# Patient Record
Sex: Male | Born: 1980 | Race: White | Hispanic: No | Marital: Married | Smoking: Former smoker
Health system: Southern US, Community
[De-identification: ages and names within clinical notes are randomized; demographics above are authoritative.]

## PROBLEM LIST (undated history)

## (undated) DIAGNOSIS — F419 Anxiety disorder, unspecified: Secondary | ICD-10-CM

## (undated) DIAGNOSIS — F329 Major depressive disorder, single episode, unspecified: Secondary | ICD-10-CM

## (undated) DIAGNOSIS — F32A Depression, unspecified: Secondary | ICD-10-CM

## (undated) DIAGNOSIS — G4730 Sleep apnea, unspecified: Secondary | ICD-10-CM

## (undated) DIAGNOSIS — M5030 Other cervical disc degeneration, unspecified cervical region: Secondary | ICD-10-CM

## (undated) HISTORY — DX: Major depressive disorder, single episode, unspecified: F32.9

## (undated) HISTORY — DX: Depression, unspecified: F32.A

## (undated) HISTORY — PX: DENTAL SURGERY: SHX609

## (undated) HISTORY — PX: OTHER SURGICAL HISTORY: SHX169

---

## 2006-09-30 ENCOUNTER — Emergency Department: Payer: Self-pay | Admitting: Emergency Medicine

## 2011-05-19 ENCOUNTER — Emergency Department (HOSPITAL_COMMUNITY)
Admission: EM | Admit: 2011-05-19 | Discharge: 2011-05-19 | Disposition: A | Discharge status: Home / Self Care | Payer: Worker's Compensation | Attending: Emergency Medicine | Admitting: Emergency Medicine

## 2011-05-19 ENCOUNTER — Encounter (HOSPITAL_COMMUNITY): Payer: Self-pay | Admitting: *Deleted

## 2011-05-19 DIAGNOSIS — S058X9A Other injuries of unspecified eye and orbit, initial encounter: Secondary | ICD-10-CM | POA: Insufficient documentation

## 2011-05-19 DIAGNOSIS — IMO0002 Reserved for concepts with insufficient information to code with codable children: Secondary | ICD-10-CM | POA: Insufficient documentation

## 2011-05-19 DIAGNOSIS — S0500XA Injury of conjunctiva and corneal abrasion without foreign body, unspecified eye, initial encounter: Secondary | ICD-10-CM

## 2011-05-19 MED ORDER — OXYCODONE-ACETAMINOPHEN 5-325 MG PO TABS: 1.0000 | ORAL_TABLET | ORAL | Status: AC | PRN

## 2011-05-19 MED ORDER — OXYCODONE-ACETAMINOPHEN 5-325 MG PO TABS
2.0000 | ORAL_TABLET | Freq: Once | ORAL | Status: AC
  Administered 2011-05-19: 2 via ORAL
  Filled 2011-05-19: qty 2

## 2011-05-19 MED ORDER — ERYTHROMYCIN 5 MG/GM OP OINT: TOPICAL_OINTMENT | OPHTHALMIC | Status: AC

## 2011-05-19 MED ORDER — TETRACAINE HCL 0.5 % OP SOLN
1.0000 [drp] | Freq: Once | OPHTHALMIC | Status: DC
  Filled 2011-05-19: qty 2

## 2011-05-19 MED ORDER — FLUORESCEIN SODIUM 1 MG OP STRP
1.0000 | ORAL_STRIP | Freq: Once | OPHTHALMIC | Status: DC
  Filled 2011-05-19: qty 1

## 2011-05-19 NOTE — Discharge Instructions (Signed)
Eye Injury The eye can be injured by scratches, foreign bodies, contact lenses, very bright light (welding torches), and chemical irritation. The cornea (the clear part of the eye) is very sensitive; even minor injuries to it are painful. Most injuries to the cornea heal in 2-4 days. Treatment may include:  Antibiotic eye drops or ointment may be needed to soothe the eye and prevent infection. Drops that numb the eye (anesthetic drops) should not be used repeatedly as they can delay healing. Drops to dilate the pupil for 1-2 days are sometimes used to relieve pain.   Patching the eye can reduce irritation from blinking and bright light. When your eye is patched, you should not drive or operate machinery because your side vision and your ability to judge distances are decreased.   Rest your eye. Stay in a darkened room and wear sunglasses to reduce the irritation from light.   Do not rub your eye for the next 2 weeks to allow complete healing.  If you have contact lenses, do not wear them until your caregiver says it is safe to do so. Pain medicine may also be needed for 1-2 days.  SEEK MEDICAL CARE IF:   You have increased pain, persistent irritation or blurred vision over the next 2 days.   Your symptoms are getting worse or not improving.   You have any other questions or concerns regarding your injury.  Document Released: 03/24/2004 Document Revised: 02/03/2011 Document Reviewed: 02/14/2005 [facility] Patient Information 2012 [place], [place].Corneal Abrasion The cornea is the clear covering at the front and center of the eye. When looking at the colored portion (iris) of the eye, you are looking through that person's cornea.  This very thin tissue is made up of many layers. The surface layer is a single layer of cells called the corneal epithelium. This is one of the most sensitive tissues in the body. If a scratch or injury causes the corneal epithelium to come off, it is called a corneal  abrasion. If the injury extends to the tissues below the epithelium, the condition is called a corneal ulcer.  CAUSES   Scratches.   Trauma.   Foreign body in the eye.   Some people have recurrences of abrasions in the area of the original injury even after they heal. This is called recurrent erosion syndrome. Recurrent erosion syndromes generally improve and go away with time.  SYMPTOMS   Eye pain.   Difficulty or inability to keep the injured eye open.   The eye becomes very sensitive to light.   Recurrent erosions tend to happen suddenly, first thing in the morning - usually upon awakening and opening the eyes.  DIAGNOSIS  Your eye professional can diagnose a corneal abrasion during an eye exam. Dye is usually placed in the eye using a drop or a small paper strip moistened by the patient's tears. When the eye is examined with a special light, the abrasion shows up clearly because of the dye. TREATMENT   Small abrasions may be treated with antibiotic drops or ointment alone.   Usually a pressure patch is specially applied. Pressure patches prevent the eye from blinking, allowing the corneal epithelium to heal. Because blinking is less, a pressure patch also reduces the amount of pain present in the eye during healing. Most corneal abrasions heal within 2-3 days with no effect on vision. WARNING: Do not drive or operate machinery while your eye is patched. Your ability to judge distances is impaired.   If  abrasion becomes infected and spreads to the deeper tissues of the cornea, a corneal ulcer can result. This is serious because it can cause corneal scarring. Corneal scars interfere with light passing through the cornea, and cause a loss of vision in the involved eye.   If your caregiver has given you a follow-up appointment, it is very important to keep that appointment. Not keeping the appointment could result in a severe eye infection or permanent loss of vision. If there is any  problem keeping the appointment, you must call back to this facility for assistance.  SEEK MEDICAL CARE IF:   You have pain, light sensitivity and a scratchy feeling in one eye (or both).   Your pressure patch keeps loosening up and you can blink your eye under the patch after treatment.   Any kind of discharge develops from the involved eye after treatment or if the lids stick together in the morning.   You have the same symptoms in the morning as you did with the original abrasion days, weeks or months after the abrasion healed.  MAKE SURE YOU:   Understand these instructions.   Will watch your condition.   Will get help right away if you are not doing well or get worse.  Document Released: 02/12/2000 Document Revised: 02/03/2011 Document Reviewed: 09/20/2007 [facility] Patient Information 2012 [place], [place].  Return for any new or worsening symptoms or any other concerns.

## 2011-05-19 NOTE — ED Provider Notes (Signed)
History     CSN: [id number]  Arrival date & time 05/19/11  1531   First MD Initiated Contact with Patient 05/19/11 1552      Chief Complaint  Patient presents with  . Eye Injury    (Consider location/radiation/quality/duration/timing/severity/associated sxs/prior treatment) HPI Cc left eye pain, hit eye with wiring while at work, pain is sharp, worse with blinking, no vision changes.  History reviewed. No pertinent past medical history.  History reviewed. No pertinent past surgical history.  No family history on file.  History  Substance Use Topics  . Smoking status: Not on file  . Smokeless tobacco: Not on file  . Alcohol Use: No      Review of Systems  Eyes: Positive for pain and redness.  All other systems reviewed and are negative.    Allergies  Review of patient's allergies indicates no known allergies.  Home Medications   Current Outpatient Rx  Name Route Sig Dispense Refill  . OXYCODONE-ACETAMINOPHEN 5-325 MG PO TABS Oral Take 1 tablet by mouth every 4 (four) hours as needed for pain. 6 tablet 0    BP 121/78  Pulse 107  Temp(Src) 98 F (36.7 C) (Oral)  Resp 20  SpO2 97%  Physical Exam  Nursing note and vitals reviewed. Constitutional: He appears well-developed and well-nourished.  HENT:  Head: Normocephalic and atraumatic.  Eyes: Pupils are equal, round, and reactive to light. Right eye exhibits no discharge. Left eye exhibits no discharge and no exudate. No foreign body present in the left eye. Left conjunctiva is injected. Left conjunctiva has no hemorrhage.  Slit lamp exam:      The left eye shows fluorescein uptake. The left eye shows no corneal flare and no foreign body.    Abdominal: Soft. There is no tenderness.  Skin: Skin is warm and dry.    ED Course  Procedures (including critical care time)  Labs Reviewed - No data to display No results found.   1. Corneal abrasion       MDM  Vision 20/20 bilat, no cell or flare on  SL, PERRLA, woods lamp exam shows large central abrasion, neg seidels, will d/c with pain meds, erythromycin ointment       Elijio Miles, MD 05/20/11 0104

## 2011-05-19 NOTE — ED Provider Notes (Signed)
31 year-old male suffered an injury to his left eye from an electrical wire. Exam shows a rather large corneal abrasion and left eye without evidence of penetrating injury. He will be treated with analgesics and antibiotic ointment.  Dione Booze, MD 05/19/11 [phone]
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
## 2011-05-25 NOTE — ED Provider Notes (Signed)
I saw and evaluated the patient, reviewed the resident's note and I agree with the findings and plan.   Reene Harlacher, MD 05/25/11 1429 

## 2012-01-18 ENCOUNTER — Emergency Department: Payer: Self-pay | Admitting: Emergency Medicine

## 2013-09-30 ENCOUNTER — Emergency Department: Payer: Self-pay | Admitting: Emergency Medicine

## 2013-09-30 LAB — CBC WITH DIFFERENTIAL/PLATELET
BASOS ABS: 0.2 10*3/uL — AB (ref 0.0–0.1)
Basophil %: 0.7 %
EOS PCT: 0.9 %
Eosinophil #: 0.2 10*3/uL (ref 0.0–0.7)
HCT: 46.1 % (ref 40.0–52.0)
HGB: 15.3 g/dL (ref 13.0–18.0)
LYMPHS ABS: 2.3 10*3/uL (ref 1.0–3.6)
LYMPHS PCT: 10.1 %
MCH: 28.6 pg (ref 26.0–34.0)
MCHC: 33.3 g/dL (ref 32.0–36.0)
MCV: 86 fL (ref 80–100)
MONOS PCT: 7 %
Monocyte #: 1.6 x10 3/mm — ABNORMAL HIGH (ref 0.2–1.0)
NEUTROS PCT: 81.3 %
Neutrophil #: 18.2 10*3/uL — ABNORMAL HIGH (ref 1.4–6.5)
Platelet: 200 10*3/uL (ref 150–440)
RBC: 5.36 10*6/uL (ref 4.40–5.90)
RDW: 14.2 % (ref 11.5–14.5)
WBC: 22.4 10*3/uL — AB (ref 3.8–10.6)

## 2013-10-01 LAB — URINALYSIS, COMPLETE
BILIRUBIN, UR: NEGATIVE
GLUCOSE, UR: NEGATIVE mg/dL (ref 0–75)
Ketone: NEGATIVE
Nitrite: POSITIVE
PROTEIN: NEGATIVE
Ph: 6 (ref 4.5–8.0)
Specific Gravity: 1.014 (ref 1.003–1.030)

## 2013-10-01 LAB — COMPREHENSIVE METABOLIC PANEL
ALBUMIN: 3.8 g/dL (ref 3.4–5.0)
ALT: 43 U/L
ANION GAP: 9 (ref 7–16)
Alkaline Phosphatase: 66 U/L
BUN: 19 mg/dL — ABNORMAL HIGH (ref 7–18)
Bilirubin,Total: 0.4 mg/dL (ref 0.2–1.0)
CO2: 29 mmol/L (ref 21–32)
CREATININE: 0.96 mg/dL (ref 0.60–1.30)
Calcium, Total: 9.2 mg/dL (ref 8.5–10.1)
Chloride: 102 mmol/L (ref 98–107)
EGFR (Non-African Amer.): >60
Glucose: 114 mg/dL — ABNORMAL HIGH (ref 65–99)
Osmolality: 283 (ref 275–301)
Potassium: 4 mmol/L (ref 3.5–5.1)
SGOT(AST): 22 U/L (ref 15–37)
SODIUM: 140 mmol/L (ref 136–145)
TOTAL PROTEIN: 7.8 g/dL (ref 6.4–8.2)

## 2013-10-01 LAB — CK TOTAL AND CKMB (NOT AT ARMC)
CK, Total: 138 U/L
CK-MB: 1.2 ng/mL (ref 0.5–3.6)

## 2014-10-13 ENCOUNTER — Emergency Department
Admission: EM | Admit: 2014-10-13 | Discharge: 2014-10-13 | Discharge status: Against Medical Advice | Payer: BLUE CROSS/BLUE SHIELD | Attending: Emergency Medicine | Admitting: Emergency Medicine

## 2014-10-13 DIAGNOSIS — Z041 Encounter for examination and observation following transport accident: Secondary | ICD-10-CM | POA: Insufficient documentation

## 2014-10-13 DIAGNOSIS — Y92410 Unspecified street and highway as the place of occurrence of the external cause: Secondary | ICD-10-CM | POA: Insufficient documentation

## 2014-10-13 DIAGNOSIS — Y998 Other external cause status: Secondary | ICD-10-CM | POA: Insufficient documentation

## 2014-10-13 DIAGNOSIS — Y9389 Activity, other specified: Secondary | ICD-10-CM | POA: Insufficient documentation
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
## 2014-10-16 ENCOUNTER — Ambulatory Visit
Admission: RE | Admit: 2014-10-16 | Discharge: 2014-10-16 | Disposition: A | Discharge status: Home / Self Care | Payer: BLUE CROSS/BLUE SHIELD | Source: Ambulatory Visit | Attending: Family Medicine | Admitting: Family Medicine

## 2014-10-16 ENCOUNTER — Ambulatory Visit (INDEPENDENT_AMBULATORY_CARE_PROVIDER_SITE_OTHER): Payer: BLUE CROSS/BLUE SHIELD | Admitting: Family Medicine

## 2014-10-16 ENCOUNTER — Other Ambulatory Visit: Payer: Self-pay | Admitting: Family Medicine

## 2014-10-16 ENCOUNTER — Encounter: Payer: Self-pay | Admitting: Family Medicine

## 2014-10-16 ENCOUNTER — Telehealth: Payer: Self-pay | Admitting: Family Medicine

## 2014-10-16 DIAGNOSIS — N390 Urinary tract infection, site not specified: Secondary | ICD-10-CM | POA: Insufficient documentation

## 2014-10-16 DIAGNOSIS — D2320 Other benign neoplasm of skin of unspecified ear and external auricular canal: Secondary | ICD-10-CM | POA: Insufficient documentation

## 2014-10-16 DIAGNOSIS — H938X9 Other specified disorders of ear, unspecified ear: Secondary | ICD-10-CM | POA: Insufficient documentation

## 2014-10-16 DIAGNOSIS — Z8249 Family history of ischemic heart disease and other diseases of the circulatory system: Secondary | ICD-10-CM | POA: Insufficient documentation

## 2014-10-16 DIAGNOSIS — M25532 Pain in left wrist: Secondary | ICD-10-CM | POA: Insufficient documentation

## 2014-10-16 DIAGNOSIS — M25531 Pain in right wrist: Secondary | ICD-10-CM

## 2014-10-16 DIAGNOSIS — H8110 Benign paroxysmal vertigo, unspecified ear: Secondary | ICD-10-CM | POA: Insufficient documentation

## 2014-10-16 DIAGNOSIS — Q541 Hypospadias, penile: Secondary | ICD-10-CM | POA: Insufficient documentation

## 2014-10-16 DIAGNOSIS — N3000 Acute cystitis without hematuria: Secondary | ICD-10-CM | POA: Insufficient documentation

## 2014-10-16 DIAGNOSIS — M79601 Pain in right arm: Secondary | ICD-10-CM | POA: Insufficient documentation

## 2014-10-16 DIAGNOSIS — M5136 Other intervertebral disc degeneration, lumbar region: Secondary | ICD-10-CM | POA: Insufficient documentation

## 2014-10-16 MED ORDER — NAPROXEN 500 MG PO TABS: 500.0000 mg | ORAL_TABLET | Freq: Two times a day (BID) | ORAL | Status: DC

## 2014-10-16 NOTE — Patient Instructions (Addendum)
Wrist pain: Take naproxen twice daily for 2 weeks. You can then use OTC Aleve or Ibuprofen as needed. Avoid excessive twisting movements.   Please let us know if pain is not resolving or you lose function in your wrist.  We will send you XRays to ortho.
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
## 2014-12-10 NOTE — Telephone Encounter (Signed)
Error

## 2014-12-22 ENCOUNTER — Encounter: Payer: Self-pay | Admitting: Family Medicine

## 2014-12-22 ENCOUNTER — Ambulatory Visit (INDEPENDENT_AMBULATORY_CARE_PROVIDER_SITE_OTHER): Payer: BLUE CROSS/BLUE SHIELD | Admitting: Family Medicine

## 2014-12-22 ENCOUNTER — Telehealth: Payer: Self-pay | Admitting: Family Medicine

## 2014-12-22 VITALS — BP 141/84 | HR 80 | Temp 98.2°F | Resp 16 | Ht 66.0 in | Wt 333.5 lb

## 2014-12-22 DIAGNOSIS — Z8639 Personal history of other endocrine, nutritional and metabolic disease: Secondary | ICD-10-CM

## 2014-12-22 DIAGNOSIS — M792 Neuralgia and neuritis, unspecified: Secondary | ICD-10-CM | POA: Diagnosis not present

## 2014-12-22 DIAGNOSIS — R739 Hyperglycemia, unspecified: Secondary | ICD-10-CM

## 2014-12-22 DIAGNOSIS — G2581 Restless legs syndrome: Secondary | ICD-10-CM | POA: Diagnosis not present

## 2014-12-22 DIAGNOSIS — M722 Plantar fascial fibromatosis: Secondary | ICD-10-CM | POA: Diagnosis not present

## 2014-12-22 DIAGNOSIS — M5136 Other intervertebral disc degeneration, lumbar region: Secondary | ICD-10-CM

## 2014-12-22 DIAGNOSIS — Z8739 Personal history of other diseases of the musculoskeletal system and connective tissue: Secondary | ICD-10-CM

## 2014-12-22 NOTE — Telephone Encounter (Signed)
appt scheduled.[place]

## 2014-12-22 NOTE — Progress Notes (Signed)
Date:  12/22/2014   Name:  David Medina   DOB:  [birth year]   MRN:  [id number]  PCP:  Leata Mouse, NP    Chief Complaint: Leg Pain   History of Present Illness:  This is a 34 y.o. male with known lumbar DDD reports B foot pain L>R over weekend, had before 3 months ago but improved, hurts most first thing in morning. Followed by [facility] for chronic back pain, takes Percocet qid and gabapentin qhs. Labs in 09/2013 showed hyperglycemia, never checked for diabetes. Also c/o numbness from knees down when sits for extended periods and legs restless most nights. Also hx gout. Weight about the same.  Review of Systems:  Review of Systems  Constitutional: Negative for fever, fatigue and unexpected weight change.  Neurological: Negative for tremors, syncope, weakness and light-headedness.    Patient Active Problem List   Diagnosis Date Noted  . Obesity, morbid, BMI 50 or higher ([place]) 12/22/2014  . Neuropathic pain 12/22/2014  . Restless leg syndrome 12/22/2014  . Hx of gout 12/22/2014  . Acute cystitis 10/16/2014  . 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 Benign paroxysmal positional nystagmus 10/16/2014  . Infection of urinary tract 10/16/2014  . Degenerative disc disease, lumbar 10/16/2014  . Blocked ear 10/16/2014  . Family history of cardiac disorder 10/16/2014  . Hypospadias, penile 10/16/2014    Prior to Admission medications   Medication Sig Start Date End Date Taking? Authorizing Provider  fluticasone (FLONASE) 50 MCG/ACT nasal spray Place 2 sprays into the nose 2 (two) times daily as needed. 05/15/14  Yes Historical Provider, MD  gabapentin (NEURONTIN) 300 MG capsule Take 1 capsule by mouth at bedtime. 04/09/14  Yes Historical Provider, MD  meclizine (ANTIVERT) 25 MG tablet Take 1 tablet by mouth every 6 (six) hours as needed. 05/15/14  Yes Historical Provider, MD  ondansetron (ZOFRAN-ODT) 4 MG disintegrating tablet Take 1 tablet by mouth every 6 (six) hours as  needed. 05/15/14  Yes Historical Provider, MD  oxyCODONE-acetaminophen (PERCOCET) 7.5-325 MG per tablet Take 1 tablet by mouth every 6 (six) hours as needed.   Yes Historical Provider, MD    No Known Allergies  No past surgical history on file.  Social History  Substance Use Topics  . Smoking status: Former Research scientist (life sciences)  . Smokeless tobacco: Not on file  . Alcohol Use: 0.0 oz/week    0 Standard drinks or equivalent per week    No family history on file.  Medication list has been reviewed and updated.  Physical Examination: BP 141/84 mmHg  Pulse 80  Temp(Src) 98.2 F (36.8 C) (Oral)  Resp 16  Ht 5\' 6"  (1.676 m)  Wt 333 lb 8 oz (151.275 kg)  BMI 53.85 kg/m2  Physical Exam  Constitutional: He appears well-developed and well-nourished.  Cardiovascular: Normal rate, regular rhythm and normal heart sounds.   Pulmonary/Chest: Effort normal and breath sounds normal.  Musculoskeletal: He exhibits no edema.  Tender anterior to calcaneus bilaterally  Neurological: He is alert.  Skin: Skin is warm and dry.  Psychiatric: He has a normal mood and affect. His behavior is normal.  Nursing note and vitals reviewed.   Assessment and Plan:  1. Plantar fasciitis, bilateral May try OTC arch supports until sees podiatry - Ambulatory referral to Podiatry  2. Obesity, morbid, BMI 50 or higher ([facility]) Check labs, discussed weight loss - TSH - CBC - Comprehensive metabolic panel  3. Hyperglycemia On labs 09/2013 - HgB A1c  4. Degenerative  disc disease, lumbar Followed by [facility]  5. Neuropathic pain Followed by [facility] on Percocet and gabapentin - B12  6. Restless leg syndrome By history, consider dopamine agonist or increased gabapentin dose if labs ok  7. Hx of gout   Return in about 4 weeks (around 01/19/2015).  Satira Anis. [facility]  12/22/2014

## 2014-12-22 NOTE — Telephone Encounter (Signed)
Pt's wife Larence Penning called to check the status of referral to leg and feet.  She said he talked to Amy about the numbness.  Her call back number is [phone]

## 2015-01-26 ENCOUNTER — Ambulatory Visit: Payer: BLUE CROSS/BLUE SHIELD | Admitting: Family Medicine

## 2015-01-28 ENCOUNTER — Ambulatory Visit: Payer: BLUE CROSS/BLUE SHIELD | Admitting: Family Medicine

## 2015-04-11 LAB — COMPREHENSIVE METABOLIC PANEL
ALBUMIN: 4.4 g/dL (ref 3.5–5.5)
ALK PHOS: 64 IU/L (ref 39–117)
ALT: 33 IU/L (ref 0–44)
AST: 19 IU/L (ref 0–40)
Albumin/Globulin Ratio: 1.6 (ref 1.1–2.5)
BUN / CREAT RATIO: 17 (ref 8–19)
BUN: 15 mg/dL (ref 6–20)
Bilirubin Total: 0.4 mg/dL (ref 0.0–1.2)
CALCIUM: 9.7 mg/dL (ref 8.7–10.2)
CO2: 24 mmol/L (ref 18–29)
CREATININE: 0.89 mg/dL (ref 0.76–1.27)
Chloride: 100 mmol/L (ref 96–106)
GFR calc Af Amer: 129 mL/min/{1.73_m2} (ref >59)
GFR, EST NON AFRICAN AMERICAN: 112 mL/min/{1.73_m2} (ref >59)
GLOBULIN, TOTAL: 2.7 g/dL (ref 1.5–4.5)
GLUCOSE: 102 mg/dL — AB (ref 65–99)
Potassium: 4.7 mmol/L (ref 3.5–5.2)
Sodium: 143 mmol/L (ref 134–144)
Total Protein: 7.1 g/dL (ref 6.0–8.5)

## 2015-04-11 LAB — CBC
HEMATOCRIT: 47.5 % (ref 37.5–51.0)
HEMOGLOBIN: 16.2 g/dL (ref 12.6–17.7)
MCH: 29.3 pg (ref 26.6–33.0)
MCHC: 34.1 g/dL (ref 31.5–35.7)
MCV: 86 fL (ref 79–97)
Platelets: 274 10*3/uL (ref 150–379)
RBC: 5.53 x10E6/uL (ref 4.14–5.80)
RDW: 14.2 % (ref 12.3–15.4)
WBC: 11.8 10*3/uL — AB (ref 3.4–10.8)

## 2015-04-11 LAB — VITAMIN B12: Vitamin B-12: 333 pg/mL (ref 211–946)

## 2015-04-11 LAB — HEMOGLOBIN A1C
ESTIMATED AVERAGE GLUCOSE: 128 mg/dL
Hgb A1c MFr Bld: 6.1 % — ABNORMAL HIGH (ref 4.8–5.6)

## 2015-04-11 LAB — TSH: TSH: 2.39 u[IU]/mL (ref 0.450–4.500)

## 2015-04-13 ENCOUNTER — Encounter: Payer: Self-pay | Admitting: Family Medicine

## 2015-04-13 DIAGNOSIS — R7303 Prediabetes: Secondary | ICD-10-CM | POA: Insufficient documentation

## 2015-07-13 ENCOUNTER — Telehealth: Payer: Self-pay | Admitting: Family Medicine

## 2015-07-13 ENCOUNTER — Other Ambulatory Visit: Payer: Self-pay | Admitting: Family Medicine

## 2015-07-13 DIAGNOSIS — R0981 Nasal congestion: Secondary | ICD-10-CM

## 2015-07-13 MED ORDER — FLUTICASONE PROPIONATE 50 MCG/ACT NA SUSP: 2.0000 | Freq: Every day | NASAL | Status: DC

## 2015-07-13 NOTE — Telephone Encounter (Signed)
Pt needs a refill on flonase sent to [facility].  His call back number is [phone]

## 2015-07-13 NOTE — Telephone Encounter (Signed)
Rx send

## 2015-08-26 ENCOUNTER — Other Ambulatory Visit: Payer: Self-pay | Admitting: Family Medicine

## 2015-10-03 ENCOUNTER — Emergency Department (HOSPITAL_COMMUNITY)
Admission: EM | Admit: 2015-10-03 | Discharge: 2015-10-03 | Disposition: A | Discharge status: Home / Self Care | Payer: 59 | Attending: Emergency Medicine | Admitting: Emergency Medicine

## 2015-10-03 ENCOUNTER — Encounter (HOSPITAL_COMMUNITY): Payer: Self-pay

## 2015-10-03 DIAGNOSIS — L03011 Cellulitis of right finger: Secondary | ICD-10-CM | POA: Diagnosis not present

## 2015-10-03 DIAGNOSIS — Z87891 Personal history of nicotine dependence: Secondary | ICD-10-CM | POA: Diagnosis not present

## 2015-10-03 DIAGNOSIS — M79644 Pain in right finger(s): Secondary | ICD-10-CM | POA: Diagnosis present

## 2015-10-03 DIAGNOSIS — I891 Lymphangitis: Secondary | ICD-10-CM

## 2015-10-03 HISTORY — DX: Other cervical disc degeneration, unspecified cervical region: M50.30

## 2015-10-03 MED ORDER — SULFAMETHOXAZOLE-TRIMETHOPRIM 800-160 MG PO TABS
1.0000 | ORAL_TABLET | Freq: Once | ORAL | Status: AC
  Administered 2015-10-03: 1 via ORAL
  Filled 2015-10-03: qty 1

## 2015-10-03 MED ORDER — IBUPROFEN 800 MG PO TABS: 800.0000 mg | ORAL_TABLET | Freq: Three times a day (TID) | ORAL | 0 refills | Status: DC

## 2015-10-03 MED ORDER — IBUPROFEN 800 MG PO TABS
800.0000 mg | ORAL_TABLET | Freq: Once | ORAL | Status: AC
  Administered 2015-10-03: 800 mg via ORAL
  Filled 2015-10-03: qty 1

## 2015-10-03 MED ORDER — SULFAMETHOXAZOLE-TRIMETHOPRIM 800-160 MG PO TABS: 1.0000 | ORAL_TABLET | Freq: Two times a day (BID) | ORAL | 0 refills | Status: AC

## 2015-10-03 NOTE — ED Triage Notes (Signed)
Abscess right right middle finger x2 days. Now having red streaks up right arm past elbow. Denies fever.

## 2015-10-03 NOTE — ED Notes (Signed)
Patient with no complaints at this time. Respirations even and unlabored. Skin warm/dry. Discharge instructions reviewed with patient at this time. Patient given opportunity to voice concerns/ask questions. Patient discharged at this time and left Emergency Department with steady gait.   

## 2015-10-03 NOTE — ED Provider Notes (Signed)
[facility] DEPT Provider Note   CSN: [id number] Arrival date & time: 10/03/15  1818  First Provider Contact:  None       History   Chief Complaint Chief Complaint  Patient presents with  . Abscess    HPI David Medina is a 35 y.o. male.  The patient is a 35 year old male, he is obese, he does not have diabetes, he states that approximately 24 hours ago he developed some pain and swelling to the proximal interphalangeal joint of the right middle finger on the volar surface. This has become slightly bruised, there is some swelling at that area with some hardness of the skin, he was working today and noticed that the skin broke open and some clear liquid drained out, it was not purulent, there was a small amount of blood as well. He denies any foreign bodies, denies any slivers, denies any injuries, has had no fevers but he has noticed that he has had some swelling and redness of his forearm and his upper arm on the medial surface today. No recent antibiotics, no history of immunocompromise      Past Medical History:  Diagnosis Date  . DDD (degenerative disc disease), cervical     Patient Active Problem List   Diagnosis Date Noted  . Prediabetes 04/13/2015  . Obesity, morbid, BMI 50 or higher ([place]) 12/22/2014  . Neuropathic pain 12/22/2014  . Restless leg syndrome 12/22/2014  . Hx of gout 12/22/2014  . Acute cystitis 10/16/2014  . 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 Benign paroxysmal positional nystagmus 10/16/2014  . Infection of urinary tract 10/16/2014  . Degenerative disc disease, lumbar 10/16/2014  . Blocked ear 10/16/2014  . Family history of cardiac disorder 10/16/2014  . Hypospadias, penile 10/16/2014    History reviewed. No pertinent surgical history.     Home Medications    Prior to Admission medications   Medication Sig Start Date End Date Taking? Authorizing Provider  fluticasone (FLONASE) 50 MCG/ACT nasal spray PLACE 2 SPRAYS INTO  BOTH NOSTRILS DAILY. 08/26/15   Arlis Porta., MD  gabapentin (NEURONTIN) 300 MG capsule Take 1 capsule by mouth at bedtime. 04/09/14   Historical Provider, MD  ibuprofen (ADVIL,MOTRIN) 800 MG tablet Take 1 tablet (800 mg total) by mouth 3 (three) times daily. 10/03/15   Noemi Chapel, MD  meclizine (ANTIVERT) 25 MG tablet Take 1 tablet by mouth every 6 (six) hours as needed. 05/15/14   Historical Provider, MD  ondansetron (ZOFRAN-ODT) 4 MG disintegrating tablet Take 1 tablet by mouth every 6 (six) hours as needed. 05/15/14   Historical Provider, MD  oxyCODONE-acetaminophen (PERCOCET) 7.5-325 MG per tablet Take 1 tablet by mouth every 6 (six) hours as needed.    Historical Provider, MD  sulfamethoxazole-trimethoprim (BACTRIM DS,SEPTRA DS) 800-160 MG tablet Take 1 tablet by mouth 2 (two) times daily. 10/03/15 10/10/15  Noemi Chapel, MD    Family History No family history on file.  Social History Social History  Substance Use Topics  . Smoking status: Former Research scientist (life sciences)  . Smokeless tobacco: Never Used  . Alcohol use No     Allergies   Review of patient's allergies indicates no known allergies.   Review of Systems Review of Systems  Constitutional: Negative for fever.  Musculoskeletal: Positive for joint swelling.  Skin: Positive for rash.     Physical Exam Updated Vital Signs BP 132/75 (BP Location: Left Arm)   Pulse 79   Temp 98.5 F (36.9 C) (Oral)  Resp 20   Ht 5\' 7"  (1.702 m)   Wt (!) 329 lb (149.2 kg)   SpO2 96%   BMI 51.53 kg/m   Physical Exam  Constitutional: He appears well-developed and well-nourished.  HENT:  Head: Normocephalic and atraumatic.  Eyes: Conjunctivae are normal. Right eye exhibits no discharge. Left eye exhibits no discharge.  Pulmonary/Chest: Effort normal. No respiratory distress.  Musculoskeletal:  The patient is able to extend and flex against resistance without pain. He has some difficulty flexing the third finger on the right because of  swelling at the joint and some pain. There is no pain palpable along the flexor or extensor surfaces. He has tattoos on that arm however there does appear to be some red streaking that goes up above the elbow. It is mostly on the medial surface of the arm. There is no lymphadenopathy in the epidural clear or the axillary regions.  Neurological: He is alert. Coordination normal.  Skin: Skin is warm and dry. Rash noted. He is not diaphoretic. There is erythema.  Psychiatric: He has a normal mood and affect.  Nursing note and vitals reviewed.    ED Treatments / Results  Labs (all labs ordered are listed, but only abnormal results are displayed) Labs Reviewed - No data to display   Radiology No results found.  Procedures Procedures (including critical care time)  Medications Ordered in ED Medications  sulfamethoxazole-trimethoprim (BACTRIM DS,SEPTRA DS) 800-160 MG per tablet 1 tablet (not administered)  ibuprofen (ADVIL,MOTRIN) tablet 800 mg (not administered)     Initial Impression / Assessment and Plan / ED Course  I have reviewed the triage vital signs and the nursing notes.  Pertinent labs & imaging results that were available during my care of the patient were reviewed by me and considered in my medical decision making (see chart for details).  Clinical Course  Comment By Time  Overall the patient appears well, there does not appear to be any signs of sepsis, no fever, no tachycardia, no hypotension. He is tolerating the pain very well. I have done a bedside ultrasound and there does not any drainable abscess. There is some signs of cellulitis with some cobblestoning though in the finger it is difficult to tell because of the small compartment. He does not have any significant amount of tenderness that would require incision and drainage at this point and he does not have any systemic symptoms. He will follow-up with his family doctor who he has confirmed he can see either on  Monday or Tuesday. The family will take pictures to share with the family doctor, he will return if his symptoms worsen, he expressed his understanding. Bactrim given prior to discharge Noemi Chapel, MD 08/05 1846      Final Clinical Impressions(s) / ED Diagnoses   Final diagnoses:  Cellulitis of finger of right hand  Lymphangitis    New Prescriptions New Prescriptions   IBUPROFEN (ADVIL,MOTRIN) 800 MG TABLET    Take 1 tablet (800 mg total) by mouth 3 (three) times daily.   SULFAMETHOXAZOLE-TRIMETHOPRIM (BACTRIM DS,SEPTRA DS) 800-160 MG TABLET    Take 1 tablet by mouth 2 (two) times daily.     Noemi Chapel, MD 10/03/15 [phone]

## 2015-10-03 NOTE — Discharge Instructions (Signed)
You MUST be rechecked in 2 days or sooner if your symptoms worsen such as increased pain / swelling / fever  Bactrim twice daily for 10 days Motrin for pain Ice, elevate Avoid work for 48 hours or until improving

## 2016-02-16 ENCOUNTER — Encounter: Payer: Self-pay | Admitting: Family Medicine

## 2016-02-16 ENCOUNTER — Ambulatory Visit (INDEPENDENT_AMBULATORY_CARE_PROVIDER_SITE_OTHER): Payer: 59 | Admitting: Family Medicine

## 2016-02-16 VITALS — BP 129/73 | HR 85 | Temp 98.7°F | Resp 16 | Ht 66.0 in | Wt 321.6 lb

## 2016-02-16 DIAGNOSIS — F418 Other specified anxiety disorders: Secondary | ICD-10-CM

## 2016-02-16 DIAGNOSIS — F5105 Insomnia due to other mental disorder: Secondary | ICD-10-CM | POA: Diagnosis not present

## 2016-02-16 DIAGNOSIS — K219 Gastro-esophageal reflux disease without esophagitis: Secondary | ICD-10-CM | POA: Diagnosis not present

## 2016-02-16 DIAGNOSIS — Z23 Encounter for immunization: Secondary | ICD-10-CM | POA: Diagnosis not present

## 2016-02-16 MED ORDER — OMEPRAZOLE 20 MG PO CPDR: 20.0000 mg | DELAYED_RELEASE_CAPSULE | Freq: Every day | ORAL | 5 refills | Status: DC

## 2016-02-16 MED ORDER — ESCITALOPRAM OXALATE 20 MG PO TABS: 20.0000 mg | ORAL_TABLET | Freq: Every day | ORAL | 5 refills | Status: DC

## 2016-02-16 NOTE — Assessment & Plan Note (Addendum)
Suspected chronic GERD with recent worsening due to stress and anxiety, also trigger foods (pizza, tomato based) and smoking - No GI red flag symptoms. Exam is unremarkable with benign abdomen without pain today - No prior history of GERD, no regular treatments with H2 blocker or PPI - but did try x 1 PPI from wife with improvement - History not suggestive of PUD (no regular NSAID use, pain not improved with eating, not long term uncontrolled GERD)  Plan: 1. Start rx Omeprazole 20mg  daily 30 min prior to 1st meal for 2 to 4 weeks, may need repeat course in future if recurrence 2. Diet modifications reduce GERD 3. Follow-up 4-6 weeks as needed, may consider prolonged course, BID, or change to alternative PPI such as protonix, anticipate some improvement with improved anxiety

## 2016-02-16 NOTE — Assessment & Plan Note (Addendum)
Secondary to uncontrolled anxiety/depression with some acute adjustment with recent stressors. - Treat underlying Anxiety/Depression with SSRI, see A&P - Future consider additional Hydroxyzine PRN anxiety/insomnia if additional needed, however no panic symptoms currently, also can consider adjunct therapy with Mirtazapine vs Trazodone in future if needed for insomnia

## 2016-02-16 NOTE — Assessment & Plan Note (Signed)
Consistent with recent worsening mood disorder with anxiety as primary symptom, possible GAD with associated depression, recent worsening over 6-8 months (work related stress and family strain), now acute worsening in past 1 month with acute adjustment with additional stressors (family, caring for family member's newborn, in addition to own family) - Secondary insomnia and reduced appetite are primary symptoms, affecting his daily function at home and work - [place] 85, somewhat difficult / GAD 19 somewhat difficult - Prior treatment depression / adjustment (with divorce 2011) Lexapro for 6 months, no other therapy  Plan: 1. Start Escitalopram (Lexapro) 20mg  tabs - take half tab 10mg  daily for 1 week, then increase up to whole tab 20mg  daily until follow-up, counseled on benefit, risk, side effects, has tolerated before in past well. Anticipate may need to take for 6-12 months 2. Recommend establishing with Psychology for therapy/counseling given symptoms affecting multiple areas of life, given self referral locations, may need referral in future 3. Emphasized importance of open communication with his wife, stressing the need for him to get appropriate sleep in order to function 4. Follow-up 4-6 weeks, adjust medication, consider adjunct med if lexapro is working with mirtazapine (sleep + appetite), trazodone (insomnia), buspar if anxiety is persistent

## 2016-02-16 NOTE — Patient Instructions (Signed)
Thank you for coming in to clinic today.  1.  As discussed, it sounds like your symptoms are primarily related to anxiety / depression disorder. This is a very common problem and be related to several factors, including life stressors. Start treatment with Escitalopram (Lexapro), take half tab 10mg  daily for next 1 week then increase to 1 WHOLE tablet 20mg  daily for next several months. As discussed most anxiety medications are also used for mood disorders such as depression, because they work on similar chemicals in your brain. It may take up to 3-4 weeks for the medicine to take full effect and for you to notice a difference, sometimes you may notice it working sooner, otherwise we may need to adjust the dose.  For most patients with anxiety or mood concerns, we generally recommend referral to establish with a therapist or counselor as well. This has been shown to improve the effectiveness of the medications, and in the future we may be able to taper off medications.   Psych Counseling ONLY  Self Referral:  1. [facility]   Address: [address], [place], [place] [address] Hours: Open today  9AM-7PM Phone: [phone]  2. [facility] [place] Address: [address] [place], [place], [place] [address] Phone: [phone]   [facility]) [place] [address] [place], [place] [address] Phone: [phone]  [facility]   Address: [place], [place],  [address] Hours: 8AM-5PM (accepts walk in to establish) Phone: [phone]  Start omeprazole 20mg - Take one capsule 30 min before first meal of day, same time every day for at least 2 weeks, max treatment up to 4 weeks then stop. If it resolves but then comes back again, you can repeat the 2-4 week course again as needed. - Avoid spicy, greasy, fried foods, also things like caffeine, dark chocolate, peppermint can worsen - Avoid  large meals and late night snacks, also do not go more than 4-5 hours without a snack or meal (not eating will worsen reflux symptoms due to stomach acid)  If the problem improves but keeps coming back, we can discuss higher dose or longer course at next visit.  If symptoms are worsening, persistent symptoms despite treatment or develop esophageal or abdominal pain, unable to swallow solids or liquids, nausea, vomiting, fever/chills, or unintentional weight loss / no appetite, please follow-up sooner or seek more immediate medical attention.  For future with anxiety / depression, consider looking into options with Mirtazapine (Remeron) - more for insomnia, appetite, and depression, and Trazodone (depression, insomnia), and Buspar (Buspirone) - generalized anxiety  Please schedule a follow-up appointment with Dr. Parks Ranger in 4 weeks for Anxiety/Depression  If you have any other questions or concerns, please feel free to call the clinic or send a message through [place]. You may also schedule an earlier appointment if necessary.  Nobie Putnam, DO [place]

## 2016-02-16 NOTE — Progress Notes (Signed)
Subjective:    Patient ID: David Medina, male    DOB: Jul 10, 1980, 35 y.o.   MRN: [id number]  David Medina is a 35 y.o. male presenting on 02/16/2016 for Anxiety (can't sleep at night); Depression; and Gastroesophageal Reflux   HPI   Anxiety / Depression / Insomnia: -  Reports similar episode in past, around 2011 when he was going through divorce with his first wife, he was prescribed Lexapro 20mg  at that time for about 6-8 months did well on this med, at that time didn't have significant insomnia, some poor sleep. - Today now follows up for acute on chronic worsening anxiety/depression, worse over past 1 month, but gradually worsening over past 6-8 months. He has since re-married. Recent primary stressor with work related problem (Currently employed as Government social research officer for [employer], [employer]) increased demand and hours at work for few months, he had been working up to 7 days a week since this past Spring around May 2017, this had affected his home life and marriage, and he had to scale back work to more 40 hours weekly (and still trying to accomplish 70 hours worth of work), recently significant worsening about 1 month ago. Additional significant stressor at home recently past few weeks, he and wife are helping raise brother in [place] child (the mother is a young mother and father is away in [place]), child is < 31 month old, and requires a lot of help, he also has 5 children of his own. - Describes primary symptom with insomnia, difficulty functioning without sleeping, and "feeling like mind won't shut down", constantly thinking and worrying, hard time with sleep onset (hard to fall asleep sometimes) otherwise if falls asleep easily will wake up and hard to go back to sleep, then he is sleepy during the day with excessive fatigue, he does a lot of driving for work towards [place], and has difficulty with this. - Describes when he has at home, his body "activates differently seems more awake  despite feeling tired" and feels like he may "disappoint his family" if he goes to sleep, does not want to miss out of something, not priority to sleep - Typically in past he has required 4-5 hours of sleep only, now not feeling rested on this amount - Admits decreased appetite with reduced interested in food, will eat breakfast in AM, often will skip lunch, difficult with work, also admits previously followed by [facility] with Lumbar DJD (treated with Percocet and Topamax), has been on Topamax for a while now thinks it is reducing his appetite, previously on Gabapentin. Also saw Weight Loss Specialist (through [facility]), only saw once, but can't follow the meal plan due to cost - Weight gain at home 350 lbs, in past 3 weeks decreased to 320 lbs  GERD: - Reports worsening over past 6-8 months, worsening with tomato based products such as pizza, spaghetti with typical symptoms with heartburn, dyspepsia, nausea - Tried wife's suspected PPI with improvement x 1 day - Active smoker - Denies any vomiting, dark stools, melena, rectal bleeding, night time cough, abdominal pain   Social History  Substance Use Topics  . Smoking status: Current Some Day Smoker  . Smokeless tobacco: Never Used     Comment: 2 cigarettes a day  . Alcohol use No    Review of Systems Per HPI unless specifically indicated above  Depression screen [facility] 2/9 02/16/2016  Decreased Interest 2  Down, Depressed, Hopeless 2  PHQ - 2 Score 4  Altered sleeping 2  Tired, decreased energy 2  Change in appetite 3  Feeling bad or failure about yourself  3  Trouble concentrating 3  Moving slowly or fidgety/restless 2  Suicidal thoughts 1  PHQ-9 Score 20  Difficult doing work/chores Somewhat difficult   GAD 7 : Generalized Anxiety Score 02/16/2016  Nervous, Anxious, on Edge 2  Control/stop worrying 3  Worry too much - different things 3  Trouble relaxing 3  Restless 3  Easily annoyed or irritable 2  Afraid - awful  might happen 3  Total GAD 7 Score 19  Anxiety Difficulty Somewhat difficult      Objective:    BP 129/73   Pulse 85   Temp 98.7 F (37.1 C) (Oral)   Resp 16   Ht 5\' 6"  (1.676 m)   Wt (!) 321 lb 9.6 oz (145.9 kg)   BMI 51.91 kg/m   Wt Readings from Last 3 Encounters:  02/16/16 (!) 321 lb 9.6 oz (145.9 kg)  10/03/15 (!) 329 lb (149.2 kg)  12/22/14 (!) 333 lb 8 oz (151.3 kg)    Physical Exam  Constitutional: He appears well-developed and well-nourished. No distress.  Well-appearing, comfortable, cooperative, obese  HENT:  Head: Normocephalic and atraumatic.  Mouth/Throat: Oropharynx is clear and moist.  Cardiovascular: Normal rate.   Pulmonary/Chest: Effort normal.  Abdominal: Soft. He exhibits no distension. There is no tenderness.  Neurological: He is alert.  Skin: Skin is warm and dry. He is not diaphoretic.  Psychiatric: His behavior is normal.  Well groomed, good eye contact, normal mood with congruent affect, without significant anxiety or depressed mood today, normal thoughts and speech, good insight into condition  Nursing note and vitals reviewed.     I have personally reviewed the following lab results from 11/2014.  Results for orders placed or performed in visit on 12/22/14  TSH  Result Value Ref Range   TSH 2.390 0.450 - 4.500 uIU/mL  HgB A1c  Result Value Ref Range   Hgb A1c MFr Bld 6.1 (H) 4.8 - 5.6 %   Est. average glucose Bld gHb Est-mCnc 128 mg/dL  CBC  Result Value Ref Range   WBC 11.8 (H) 3.4 - 10.8 x10E3/uL   RBC 5.53 4.14 - 5.80 x10E6/uL   Hemoglobin 16.2 12.6 - 17.7 g/dL   Hematocrit 47.5 37.5 - 51.0 %   MCV 86 79 - 97 fL   MCH 29.3 26.6 - 33.0 pg   MCHC 34.1 31.5 - 35.7 g/dL   RDW 14.2 12.3 - 15.4 %   Platelets 274 150 - 379 x10E3/uL  B12  Result Value Ref Range   Vitamin B-12 333 211 - 946 pg/mL  Comprehensive metabolic panel  Result Value Ref Range   Glucose 102 (H) 65 - 99 mg/dL   BUN 15 6 - 20 mg/dL   Creatinine, Ser 0.89 0.76  - 1.27 mg/dL   GFR calc non Af Amer 112 >59 mL/min/1.73   GFR calc Af Amer 129 >59 mL/min/1.73   BUN/Creatinine Ratio 17 8 - 19   Sodium 143 134 - 144 mmol/L   Potassium 4.7 3.5 - 5.2 mmol/L   Chloride 100 96 - 106 mmol/L   CO2 24 18 - 29 mmol/L   Calcium 9.7 8.7 - 10.2 mg/dL   Total Protein 7.1 6.0 - 8.5 g/dL   Albumin 4.4 3.5 - 5.5 g/dL   Globulin, Total 2.7 1.5 - 4.5 g/dL   Albumin/Globulin Ratio 1.6 1.1 - 2.5   Bilirubin  Total 0.4 0.0 - 1.2 mg/dL   Alkaline Phosphatase 64 39 - 117 IU/L   AST 19 0 - 40 IU/L   ALT 33 0 - 44 IU/L      Assessment & Plan:   Problem List Items Addressed This Visit    Insomnia secondary to depression with anxiety    Secondary to uncontrolled anxiety/depression with some acute adjustment with recent stressors. - Treat underlying Anxiety/Depression with SSRI, see A&P - Future consider additional Hydroxyzine PRN anxiety/insomnia if additional needed, however no panic symptoms currently, also can consider adjunct therapy with Mirtazapine vs Trazodone in future if needed for insomnia      Relevant Medications   escitalopram (LEXAPRO) 20 MG tablet   Gastroesophageal reflux disease    Suspected chronic GERD with recent worsening due to stress and anxiety, also trigger foods (pizza, tomato based) and smoking - No GI red flag symptoms. Exam is unremarkable with benign abdomen without pain today - No prior history of GERD, no regular treatments with H2 blocker or PPI - but did try x 1 PPI from wife with improvement - History not suggestive of PUD (no regular NSAID use, pain not improved with eating, not long term uncontrolled GERD)  Plan: 1. Start rx Omeprazole 20mg  daily 30 min prior to 1st meal for 2 to 4 weeks, may need repeat course in future if recurrence 2. Diet modifications reduce GERD 3. Follow-up 4-6 weeks as needed, may consider prolonged course, BID, or change to alternative PPI such as protonix, anticipate some improvement with improved  anxiety       Relevant Medications   omeprazole (PRILOSEC) 20 MG capsule   Anxiety associated with depression - Primary    Consistent with recent worsening mood disorder with anxiety as primary symptom, possible GAD with associated depression, recent worsening over 6-8 months (work related stress and family strain), now acute worsening in past 1 month with acute adjustment with additional stressors (family, caring for family member's newborn, in addition to own family) - Secondary insomnia and reduced appetite are primary symptoms, affecting his daily function at home and work - [place] 31, somewhat difficult / GAD 19 somewhat difficult - Prior treatment depression / adjustment (with divorce 2011) Lexapro for 6 months, no other therapy  Plan: 1. Start Escitalopram (Lexapro) 20mg  tabs - take half tab 10mg  daily for 1 week, then increase up to whole tab 20mg  daily until follow-up, counseled on benefit, risk, side effects, has tolerated before in past well. Anticipate may need to take for 6-12 months 2. Recommend establishing with Psychology for therapy/counseling given symptoms affecting multiple areas of life, given self referral locations, may need referral in future 3. Emphasized importance of open communication with his wife, stressing the need for him to get appropriate sleep in order to function 4. Follow-up 4-6 weeks, adjust medication, consider adjunct med if lexapro is working with mirtazapine (sleep + appetite), trazodone (insomnia), buspar if anxiety is persistent      Relevant Medications   escitalopram (LEXAPRO) 20 MG tablet    Other Visit Diagnoses    Need for Tdap vaccination       Relevant Orders   Tdap vaccine greater than or equal to 7yo IM (Completed)      Meds ordered this encounter  Medications  .       . escitalopram (LEXAPRO) 20 MG tablet    Sig: Take 1 tablet (20 mg total) by mouth daily. For first week cut tabs in half for 10mg   Dispense:  30 tablet    Refill:   5  . omeprazole (PRILOSEC) 20 MG capsule    Sig: Take 1 capsule (20 mg total) by mouth daily. 30 min before first meal of day. Take for 1 month, then may stop if resolved.    Dispense:  30 capsule    Refill:  5      Follow up plan: Return in about 4 weeks (around 03/15/2016) for anxiety, depression, insomnia.  Initially scheduled for physical today, changed to acute visit for significant concerns, advised to follow-up in future for annual physical after 03/2016  Nobie Putnam, [facility] 02/16/2016, 6:17 PM

## 2016-04-13 ENCOUNTER — Other Ambulatory Visit: Payer: Self-pay | Admitting: Family Medicine

## 2016-04-13 DIAGNOSIS — F5105 Insomnia due to other mental disorder: Secondary | ICD-10-CM

## 2016-04-13 DIAGNOSIS — F418 Other specified anxiety disorders: Secondary | ICD-10-CM

## 2016-04-13 MED ORDER — ESCITALOPRAM OXALATE 20 MG PO TABS: 20.0000 mg | ORAL_TABLET | Freq: Every day | ORAL | 0 refills | Status: DC

## 2016-06-29 ENCOUNTER — Other Ambulatory Visit: Payer: Self-pay | Admitting: Family Medicine

## 2016-06-29 DIAGNOSIS — K219 Gastro-esophageal reflux disease without esophagitis: Secondary | ICD-10-CM

## 2016-08-03 ENCOUNTER — Ambulatory Visit (INDEPENDENT_AMBULATORY_CARE_PROVIDER_SITE_OTHER): Payer: Medicaid Other | Admitting: Family Medicine

## 2016-08-03 ENCOUNTER — Encounter: Payer: Self-pay | Admitting: Family Medicine

## 2016-08-03 VITALS — BP 109/71 | HR 81 | Temp 98.1°F | Resp 16 | Ht 66.0 in | Wt 322.0 lb

## 2016-08-03 DIAGNOSIS — M792 Neuralgia and neuritis, unspecified: Secondary | ICD-10-CM | POA: Diagnosis not present

## 2016-08-03 DIAGNOSIS — F5105 Insomnia due to other mental disorder: Secondary | ICD-10-CM | POA: Diagnosis not present

## 2016-08-03 DIAGNOSIS — M5136 Other intervertebral disc degeneration, lumbar region: Secondary | ICD-10-CM | POA: Diagnosis not present

## 2016-08-03 DIAGNOSIS — F418 Other specified anxiety disorders: Secondary | ICD-10-CM

## 2016-08-03 DIAGNOSIS — G894 Chronic pain syndrome: Secondary | ICD-10-CM | POA: Insufficient documentation

## 2016-08-03 MED ORDER — TIZANIDINE HCL 4 MG PO CAPS: 4.0000 mg | ORAL_CAPSULE | Freq: Three times a day (TID) | ORAL | 2 refills | Status: DC | PRN

## 2016-08-03 MED ORDER — TOPIRAMATE 50 MG PO TABS: ORAL_TABLET | ORAL | 2 refills | Status: DC

## 2016-08-03 MED ORDER — PREDNISONE 20 MG PO TABS: ORAL_TABLET | ORAL | 0 refills | Status: DC

## 2016-08-03 MED ORDER — OXYCODONE-ACETAMINOPHEN 5-325 MG PO TABS: 1.0000 | ORAL_TABLET | Freq: Four times a day (QID) | ORAL | 0 refills | Status: DC | PRN

## 2016-08-03 MED ORDER — FLUTICASONE PROPIONATE 50 MCG/ACT NA SUSP: NASAL | 5 refills | Status: DC

## 2016-08-03 MED ORDER — ESCITALOPRAM OXALATE 20 MG PO TABS: 20.0000 mg | ORAL_TABLET | Freq: Every day | ORAL | 1 refills | Status: DC

## 2016-08-03 NOTE — Assessment & Plan Note (Signed)
Likely contributing factor to chronic pain See A&P pain management for now, if more function will need to improve exercise and work on weight loss strategies

## 2016-08-03 NOTE — Patient Instructions (Addendum)
Thank you for coming to the clinic today.  1.   Urgent referral to [facility] Neurosurgery and Pain Management  [facility] and Neurosurgery [facility] [address], [place]  [address] Appointments: [phone]  Stay tuned for appointment  Today re-ordered Topamax, Tizanidine - increase dose gradually  Prescribed Prednisone 12 day taper - you may take this medicine in future or may start now, it is strong anti-inflammatory - You cannot mix this with ibuprofen, advil, aleve, naproxen  Ibuprofen 800mg  3 times daily is max dose - caution taking this regularly  Recommend to start taking Tylenol Extra Strength 500mg  tabs - take 1 to 2 tabs per dose (max 1000mg ) every 6-8 hours for pain (take regularly, don't skip a dose for next 7 days), max 24 hour daily dose is 6 tablets or 3000mg . In the future you can repeat the same everyday Tylenol course for 1-2 weeks at a time.  Temporary supply Oxycodone 5/325 - this is one time only rx for now until we determine next schedule with Pain Management.  Please have [facility] send me request  [facility] Fax: [phone]  Please schedule a Follow-up Appointment to: Return in about 6 weeks (around 09/14/2016) for Low Back Pain / Meds.  If you have any other questions or concerns, please feel free to call the clinic or send a message through [place]. You may also schedule an earlier appointment if necessary.  Nobie Putnam, DO [place]

## 2016-08-03 NOTE — Assessment & Plan Note (Addendum)
Subacute on chronic LBP with associated b/l sciatica / radiculopathy. WIthout new injury with known chronic LBP with severe DJD - Some red flag symptoms without evidence or symptoms of cauda equina. Positive SLR for radiculopathy -Refractory to conservative therapy, moderate improvement on significant multi-drug regimen including opioids - Reviewed [place] CSRS today for past 1 year, consistent with chronic oxycodone use from Pain Management without red flags, last rx 05/2016 Oxycodone 5/325 for 21 day supply after losing insurance  Plan: 1. Urgent referral for establish as new patient to [facility] Neurosurgery and Pain Management - office staff to contact [facility] office tomorrow on 6/7 to discuss case and determine if able to get him sooner to one or the other provider 2. Discussion today on difficulty with acutely resuming his chronic pain management - advised that I do not routinely do this or prescribe these meds long-term. I requested record from [facility] for review, I do not have any MRI or imaging available. However [place] CSRS matches his history. Agreed to an initial temporary bridge rx of Oxycodone 5/325 #30 pills take 1-2 q 6 hr PRN pain for approx 5 day supply until we can solidify referral plan for him. 3. Refill other pain management meds for now - Topamax 50 AM / 100mg  PM, trial on Tizanidine muscle relaxant 4mg  up to TID PRN, continue Ibuprofen OTC up to 800 TID max dose, maximize Tylenol 1g TID PRN 4. Also sent rx Prednisone 12 day taper - discussed benefit/risk use only if worsening and need stronger anti-inflammatory not to double up NSAIDs on prednisone 5. Follow-up 6 weeks for updates, refills and management  Strict return criteria given when to seek more immediate care at hospital ED or follow-up sooner

## 2016-08-03 NOTE — Assessment & Plan Note (Signed)
Improved on SSRI, lexapro

## 2016-08-03 NOTE — Assessment & Plan Note (Signed)
Secondary to OA/DJD radiculopathy See A&P chronic pain syndrome

## 2016-08-03 NOTE — Progress Notes (Signed)
Subjective:    Patient ID: David Medina, male    DOB: Jul 08, 1980, 36 y.o.   MRN: [id number]  David Medina is a 36 y.o. male presenting on 08/03/2016 for Numbness (hands legs feets both side onset months as per pt didn't have any insurance so didn't get MRI done ) and Back Pain   HPI   LOW BACK PAIN with DJD and Radiculopathy / CHRONIC PAIN SYNDROME - Reviews prior known history of chronic low back pain with OA/DJD >3-5 years or more, he has been followed by [facility] Management for about 3 years now. Prior MRI imaging and has been doing variety of therapies including chronic opioid pain management with good results improving his function. Due to changes with job and other financial reasons he lost insurance then was on waiting list for Medicaid ins and was not covered for a while, therefore he could not afford to follow-up with Pain Management. He states they were planning to update Lumbar MRI imaging soon, due to worsening nerve pain, numbness, tingling paresthesias in bilateral upper and lower extremities, but this was never able to be obtained. His last visit was in 05/2016 and he was given lower dose Oxycodone x 3 week supply to help taper down since without care. - Here today to request new referral for Pain Management, now that he has Medicaid ins - Recently >6 months seems to have gradual worsening to persistent pain. Describes pain as severe intermittent worsening with radiating paresthesia pain in all extremities, not one side worse than other, sometimes difficult to get out of bed - Previous regimen from Pain Management: Oxycodone/Acetaminophen 7.5/325 x 1 tab q 6 hours (4 daily) #120 per month with regular use until recently he has self tapered doses down due to limited supply has < 10 pills left, also on Topamax 50mg  in AM and 100mg  in PM (failed gabapentin), Ibuprofen 800mg  OTC 2-4 times daily - Regarding referral, states that [facility] requires refer to  Orthopedics first and would take 4 months for them to get him back into Pain Clinic. He is open to other Pain Management options for second opinion as well. - Morbid obesity with weight gain, complicating his pain - Denies any fevers/chills, weakness, loss of control bladder/bowel incontinence or retention, unintentional wt loss, night sweats   FOLLOW-UP Anxiety/Depression/Insomnia: - Last visit to discuss this problem on 02/16/16, see note for detailed background information, with recurrent significant episodes of acute stress in life in past and now more recently. He was given rx Lexapro 20mg  daily and recommend therapy/counseling. - Today he reports improved anxiety/mood and sleep have all significantly improved back on the Lexapro 20mg  daily  Declined to repeat GAD7/PHQ9 screening today, as this was not primary focus of visit.  GAD 7 : Generalized Anxiety Score 02/16/2016  Nervous, Anxious, on Edge 2  Control/stop worrying 3  Worry too much - different things 3  Trouble relaxing 3  Restless 3  Easily annoyed or irritable 2  Afraid - awful might happen 3  Total GAD 7 Score 19  Anxiety Difficulty Somewhat difficult   Depression screen PHQ 2/9 02/16/2016  Decreased Interest 2  Down, Depressed, Hopeless 2  PHQ - 2 Score 4  Altered sleeping 2  Tired, decreased energy 2  Change in appetite 3  Feeling bad or failure about yourself  3  Trouble concentrating 3  Moving slowly or fidgety/restless 2  Suicidal thoughts 1  PHQ-9 Score 20  Difficult doing work/chores  Somewhat difficult    Social History  Substance Use Topics  . Smoking status: Former Research scientist (life sciences)  . Smokeless tobacco: Former Systems developer     Comment: 2 cigarettes a day  . Alcohol use 0.0 oz/week     Comment: occ    Review of Systems Per HPI unless specifically indicated above     Objective:    BP 109/71   Pulse 81   Temp 98.1 F (36.7 C) (Oral)   Resp 16   Ht 5\' 6"  (1.676 m)   Wt (!) 322 lb (146.1 kg)   BMI 51.97  kg/m   Wt Readings from Last 3 Encounters:  08/03/16 (!) 322 lb (146.1 kg)  02/16/16 (!) 321 lb 9.6 oz (145.9 kg)  10/03/15 (!) 329 lb (149.2 kg)    Physical Exam  Constitutional: He appears well-developed and well-nourished. No distress.  Well-appearing, currently uncomfortable due to back pain, cooperative, morbidly obese  HENT:  Head: Normocephalic and atraumatic.  Mouth/Throat: Oropharynx is clear and moist.  Eyes: Conjunctivae are normal. Pupils are equal, round, and reactive to light. Right eye exhibits no discharge. Left eye exhibits no discharge.  Neck: Normal range of motion. Neck supple.  Cardiovascular: Normal rate, regular rhythm, normal heart sounds and intact distal pulses.   No murmur heard. Pulmonary/Chest: Effort normal and breath sounds normal. No respiratory distress. He has no wheezes. He has no rales.  Musculoskeletal: He exhibits no edema.  Low Back Inspection: Normal appearance, Large body habitus, no spinal deformity, symmetrical. Palpation: No tenderness over spinous processes. Localized pain to middle of low back L4-5 and SI area in wide region without reproducible pain on palpable, surrounding paraspinal muscles hypertonicity and spasm ROM: Some limited active ROM forward flex / back extension Special Testing: Seated SLR positive for radicular pain bilaterally  Strength: Bilateral hip flex/ext 5/5, knee flex/ext 5/5, ankle dorsiflex/plantarflex 5/5 Neurovascular: intact distal sensation to light touch  Neurological: He is alert.  Bilateral patellar DTR trace (difficulty with thicker pants on unable to roll up legs during exam), achilles trace to +1 symmetrical  Distal sensation to light touch intact, only mild reduced bilateral hands  Skin: Skin is warm and dry. No rash noted. He is not diaphoretic. No erythema.  Psychiatric: He has a normal mood and affect. His behavior is normal.  Well groomed, good eye contact, normal speech and thoughts. Does not appear  anxious.  Nursing note and vitals reviewed.       Assessment & Plan:   Problem List Items Addressed This Visit    Obesity, morbid, BMI 50 or higher ([place])    Likely contributing factor to chronic pain See A&P pain management for now, if more function will need to improve exercise and work on weight loss strategies      Neuropathic pain    Secondary to OA/DJD radiculopathy See A&P chronic pain syndrome      Relevant Medications   topiramate (TOPAMAX) 50 MG tablet   predniSONE (DELTASONE) 20 MG tablet   tiZANidine (ZANAFLEX) 4 MG capsule   oxyCODONE-acetaminophen (ROXICET) 5-325 MG tablet   Other Relevant Orders   Ambulatory referral to Pain Clinic   Ambulatory referral to Neurosurgery   Insomnia secondary to depression with anxiety    Improved on SSRI, lexapro      Relevant Medications   escitalopram (LEXAPRO) 20 MG tablet   Degenerative disc disease, lumbar - Primary    Primary etiology for chronic pain See A&P      Relevant Medications  predniSONE (DELTASONE) 20 MG tablet   tiZANidine (ZANAFLEX) 4 MG capsule   oxyCODONE-acetaminophen (ROXICET) 5-325 MG tablet   Other Relevant Orders   Ambulatory referral to Pain Clinic   Ambulatory referral to Neurosurgery   Chronic pain syndrome    Subacute on chronic LBP with associated b/l sciatica / radiculopathy. WIthout new injury with known chronic LBP with severe DJD - Some red flag symptoms without evidence or symptoms of cauda equina. Positive SLR for radiculopathy -Refractory to conservative therapy, moderate improvement on significant multi-drug regimen including opioids - Reviewed [place] CSRS today for past 1 year, consistent with chronic oxycodone use from Pain Management without red flags, last rx 05/2016 Oxycodone 5/325 for 21 day supply after losing insurance  Plan: 1. Urgent referral for establish as new patient to [facility] Neurosurgery and Pain Management - office staff to contact [facility] office tomorrow on 6/7 to  discuss case and determine if able to get him sooner to one or the other provider 2. Discussion today on difficulty with acutely resuming his chronic pain management - advised that I do not routinely do this or prescribe these meds long-term. I requested record from [facility] for review, I do not have any MRI or imaging available. However [place] CSRS matches his history. Agreed to an initial temporary bridge rx of Oxycodone 5/325 #30 pills take 1-2 q 6 hr PRN pain for approx 5 day supply until we can solidify referral plan for him. 3. Refill other pain management meds for now - Topamax 50 AM / 100mg  PM, trial on Tizanidine muscle relaxant 4mg  up to TID PRN, continue Ibuprofen OTC up to 800 TID max dose, maximize Tylenol 1g TID PRN 4. Also sent rx Prednisone 12 day taper - discussed benefit/risk use only if worsening and need stronger anti-inflammatory not to double up NSAIDs on prednisone 5. Follow-up 6 weeks for updates, refills and management  Strict return criteria given when to seek more immediate care at hospital ED or follow-up sooner      Relevant Medications   topiramate (TOPAMAX) 50 MG tablet   tiZANidine (ZANAFLEX) 4 MG capsule   oxyCODONE-acetaminophen (ROXICET) 5-325 MG tablet   Other Relevant Orders   Ambulatory referral to Pain Clinic   Ambulatory referral to Neurosurgery   Anxiety associated with depression    Significant improvement on SSRI with anxiety/mood/insomnia, now complicated by debilitating chronic LBP with radiculopathy, stress without insurance previously lost his Pain Management  Plan: 1. Refilled Lexapro 20mg  daily continue for now likely 6-12 months or more 2. Again recommend establishing with therapy/counseling 3. Follow-up 6 weeks or as needed - consider adjunct meds if needed - considered Cymbalta but patient has had side effects to this in past      Relevant Medications   escitalopram (LEXAPRO) 20 MG tablet      Meds ordered this encounter   Medications  . DISCONTD: topiramate (TOPAMAX) 100 MG tablet    Sig: Take 100 mg by mouth.  . topiramate (TOPAMAX) 50 MG tablet    Sig: Taking 1 tab (50mg ) in morning and 2 tabs (100mg ) in evening, take dose 1 hour before meal    Dispense:  90 tablet    Refill:  2  . predniSONE (DELTASONE) 20 MG tablet    Sig: Take 2 tablets daily (40mg ) for 4 days, take 1 tab daily (20mg ) for 4 days, take half tab daily (10mg ) for 4 days    Dispense:  14 tablet    Refill:  0  . tiZANidine (ZANAFLEX) 4 MG capsule    Sig: Take 1 capsule (4 mg total) by mouth 3 (three) times daily as needed for muscle spasms.    Dispense:  60 capsule    Refill:  2  . oxyCODONE-acetaminophen (ROXICET) 5-325 MG tablet    Sig: Take 1-2 tablets by mouth every 6 (six) hours as needed for severe pain.    Dispense:  30 tablet    Refill:  0  . fluticasone (FLONASE) 50 MCG/ACT nasal spray    Sig: PLACE 2 SPRAYS INTO BOTH NOSTRILS DAILY.    Dispense:  16 g    Refill:  5  . escitalopram (LEXAPRO) 20 MG tablet    Sig: Take 1 tablet (20 mg total) by mouth daily.    Dispense:  90 tablet    Refill:  1    Follow up plan: Return in about 6 weeks (around 09/14/2016) for Low Back Pain / Meds.  Nobie Putnam, [facility] 08/03/2016, 11:27 PM

## 2016-08-03 NOTE — Assessment & Plan Note (Signed)
Significant improvement on SSRI with anxiety/mood/insomnia, now complicated by debilitating chronic LBP with radiculopathy, stress without insurance previously lost his Pain Management  Plan: 1. Refilled Lexapro 20mg  daily continue for now likely 6-12 months or more 2. Again recommend establishing with therapy/counseling 3. Follow-up 6 weeks or as needed - consider adjunct meds if needed - considered Cymbalta but patient has had side effects to this in past

## 2016-08-03 NOTE — Assessment & Plan Note (Signed)
Primary etiology for chronic pain See A&P

## 2016-08-22 LAB — IGF-BP1: IGF-BP1: 86 — AB (ref 88–246)

## 2016-08-22 LAB — ANA COMPREHENSIVE PANEL
ANA by IFA: NEGATIVE
ANTI-DNA (DS) AB QN: 1 (ref 0–9)
Sjogren's Anti-SS-B: <0.2 (ref 0–0.9)

## 2016-08-22 LAB — HEAVY METALS PANEL, BLOOD
ARSENIC: 4 (ref 2–23)
MERCURY - BLOOD: NOT DETECTED

## 2016-08-22 LAB — HEPATIC FUNCTION PANEL
ALK PHOS: 58 (ref 25–125)
ALT: 21 (ref 10–40)
AST: 19 (ref 14–40)
BILIRUBIN DIRECT: 0.1 (ref 0.01–0.4)
BILIRUBIN, TOTAL: 0.3

## 2016-08-22 LAB — HIV ANTIBODY (ROUTINE TESTING W REFLEX): HIV: NOT DETECTED

## 2016-08-22 LAB — CBC AND DIFFERENTIAL
HEMATOCRIT: 43 (ref 41–53)
Hemoglobin: 14.5 (ref 13.5–17.5)
NEUTROS ABS: 10
Platelets: 217 (ref 150–399)
WBC: 14.3

## 2016-08-22 LAB — VITAMIN B12: Vitamin B-12: 412

## 2016-08-22 LAB — BASIC METABOLIC PANEL
BUN: 12 (ref 4–21)
CREATININE: 0.9 (ref 0.6–1.3)
GLUCOSE: 90
Potassium: 3.5 (ref 3.4–5.3)
SODIUM: 140 (ref 137–147)

## 2016-08-22 LAB — TESTOSTERONE,FREE AND TOTAL
TESTOSTERONE FREE: 2 — AB (ref 8.7–25.1)
TESTOSTERONE: 117 — AB (ref 264–916)

## 2016-08-22 LAB — DHEA-SULFATE, SERUM: DHEA: 88 (ref 31–701)

## 2016-08-22 LAB — LYME AB, TOTAL/IGM RESPONSES: Lyme IgG/IgM Ab: <0.91 (ref 0–0.90)

## 2016-08-22 LAB — PROTEIN ELECTROPHORESIS
A/G RATIO: 1.3 (ref 0.7–1.7)
ALBUMIN: 3.8 (ref 2.9–4.4)
Alpha-1-Globulin: 0.2 (ref 0–0.4)
Alpha-2-Globulin: 0.7 (ref 0.4–1)
Beta Globulin: 0.9 (ref 0.7–1.3)
GLOBULIN, TOTAL: 3 (ref 2.2–3.9)
Gamma Globulin: 1.1 (ref 0.4–1.8)

## 2016-08-22 LAB — VITAMIN B1: Vitamin B1 (Thiamine): 165 (ref 66.5–200)

## 2016-08-22 LAB — ROCKY MTN SPOTTED FVR AB, IGG-BLOOD: ROCKY MT SPOTTED FEVER IGG: NEGATIVE

## 2016-08-22 LAB — URIC ACID: URIC ACID: 6.8 (ref 3.7–8.6)

## 2016-08-22 LAB — VITAMIN B6: Vitamin B6: 47.8 — AB (ref 5.3–46.7)

## 2016-08-22 LAB — C-REACTIVE PROTEIN: CRP: 7.1 — AB (ref 0.0–4.9)

## 2016-08-22 LAB — POCT BLOOD LEAD

## 2016-08-22 LAB — TSH: TSH: 1.87 (ref 0.41–5.90)

## 2016-08-22 LAB — THYROID PANEL
Free Thyroxine Index: 1.7 (ref 1.2–4.9)
T3 UPTAKE: 24 (ref 24–39)
TSH: 1.87
Thyroxine (T4): 7 (ref 4.5–12)

## 2016-08-22 LAB — CYCLIC CITRUL PEPTIDE ANTIBODY, IGG: CCP ANTIBODIES IGG/IGA: 5 (ref 0–19)

## 2016-08-22 LAB — POCT ERYTHROCYTE SEDIMENTATION RATE, NON-AUTOMATED: Sed Rate: 18

## 2016-08-22 LAB — VITAMIN D 25 HYDROXY (VIT D DEFICIENCY, FRACTURES): Vit D, 25-Hydroxy: 28

## 2016-09-06 ENCOUNTER — Ambulatory Visit
Admission: EM | Admit: 2016-09-06 | Discharge: 2016-09-06 | Disposition: A | Discharge status: Home / Self Care | Payer: Medicaid Other | Attending: Family Medicine | Admitting: Family Medicine

## 2016-09-06 ENCOUNTER — Encounter: Payer: Self-pay | Admitting: *Deleted

## 2016-09-06 ENCOUNTER — Ambulatory Visit: Payer: Medicaid Other

## 2016-09-06 DIAGNOSIS — S6992XA Unspecified injury of left wrist, hand and finger(s), initial encounter: Secondary | ICD-10-CM | POA: Diagnosis not present

## 2016-09-06 DIAGNOSIS — S6991XA Unspecified injury of right wrist, hand and finger(s), initial encounter: Secondary | ICD-10-CM | POA: Diagnosis not present

## 2016-09-06 DIAGNOSIS — S60221A Contusion of right hand, initial encounter: Secondary | ICD-10-CM | POA: Diagnosis not present

## 2016-09-06 DIAGNOSIS — S62397A Other fracture of fifth metacarpal bone, left hand, initial encounter for closed fracture: Secondary | ICD-10-CM | POA: Diagnosis not present

## 2016-09-06 DIAGNOSIS — X58XXXA Exposure to other specified factors, initial encounter: Secondary | ICD-10-CM | POA: Insufficient documentation

## 2016-09-06 DIAGNOSIS — S60222A Contusion of left hand, initial encounter: Secondary | ICD-10-CM | POA: Diagnosis not present

## 2016-09-06 DIAGNOSIS — S60052A Contusion of left little finger without damage to nail, initial encounter: Secondary | ICD-10-CM

## 2016-09-06 DIAGNOSIS — S6000XA Contusion of unspecified finger without damage to nail, initial encounter: Secondary | ICD-10-CM

## 2016-09-06 DIAGNOSIS — S62309A Unspecified fracture of unspecified metacarpal bone, initial encounter for closed fracture: Secondary | ICD-10-CM

## 2016-09-06 MED ORDER — MELOXICAM 15 MG PO TABS: 15.0000 mg | ORAL_TABLET | Freq: Every day | ORAL | 0 refills | Status: DC

## 2016-09-06 MED ORDER — KETOROLAC TROMETHAMINE 60 MG/2ML IM SOLN
60.0000 mg | Freq: Once | INTRAMUSCULAR | Status: AC
  Administered 2016-09-06: 60 mg via INTRAMUSCULAR

## 2016-09-06 NOTE — ED Provider Notes (Signed)
[facility]    CSN: [id number] Arrival date & time: 09/06/16  1047     History   Chief Complaint Chief Complaint  Patient presents with  . Hand Injury    HPI Jeramey Lanuza is a 36 y.o. male.   Patient is a 36 year old white male who reports injuring his working on at home apparently rolled over both his hand really hurting the left hand and only mildly hurting her right hand. He reports swelling of both hands (worse than the right good range of motion came in to be evaluated. There is a small abrasion on his left hand but he thinks his tetanus is up-to-date no known drug allergies no pertinent family medical history relevant to today's visit. He is a former smoker.   The history is provided by the patient. No language interpreter was used.  Hand Injury  Location:  Hand Hand location:  L hand and R hand Injury: yes   Mechanism of injury: crush   Crush injury:    Mechanism: roll of engine on both hands. Pain details:    Quality:  Aching   Radiates to:  Does not radiate   Severity:  Moderate   Timing:  Constant   Progression:  Worsening Handedness:  Right-handed Dislocation: no   Foreign body present:  No foreign bodies Tetanus status:  Up to date Prior injury to area:  No Relieved by:  Nothing Worsened by:  Nothing   Past Medical History:  Diagnosis Date  . DDD (degenerative disc disease), cervical     Patient Active Problem List   Diagnosis Date Noted  . Chronic pain syndrome 08/03/2016  . Anxiety associated with depression 02/16/2016  . Insomnia secondary to depression with anxiety 02/16/2016  . Gastroesophageal reflux disease 02/16/2016  . Prediabetes 04/13/2015  . Obesity, morbid, BMI 50 or higher ([place]) 12/22/2014  . Neuropathic pain 12/22/2014  . Restless leg syndrome 12/22/2014  . Hx of gout 12/22/2014  . Acute cystitis 10/16/2014  . 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 Benign paroxysmal positional nystagmus 10/16/2014    . Infection of urinary tract 10/16/2014  . Degenerative disc disease, lumbar 10/16/2014  . Blocked ear 10/16/2014  . Family history of cardiac disorder 10/16/2014  . Hypospadias, penile 10/16/2014    History reviewed. No pertinent surgical history.     Home Medications    Prior to Admission medications   Medication Sig Start Date End Date Taking? Authorizing Provider  escitalopram (LEXAPRO) 20 MG tablet Take 1 tablet (20 mg total) by mouth daily. 08/03/16  Yes Karamalegos, Alexander J, DO  fluticasone (FLONASE) 50 MCG/ACT nasal spray PLACE 2 SPRAYS INTO BOTH NOSTRILS DAILY. 08/03/16  Yes Karamalegos, Devonne Doughty, DO  topiramate (TOPAMAX) 50 MG tablet Taking 1 tab (50mg ) in morning and 2 tabs (100mg ) in evening, take dose 1 hour before meal 08/03/16  Yes Karamalegos, Alexander J, DO  meloxicam (MOBIC) 15 MG tablet Take 1 tablet (15 mg total) by mouth daily. 09/06/16   Frederich Cha, MD  omeprazole (PRILOSEC) 20 MG capsule TAKE 1 CAP BY MOUTH DAILY, 30 MIN BEFORE FIRST MEAL OF DAY - TAKE FOR 1 MONTH THEN STOP IF RESOLVED 06/29/16   Karamalegos, Devonne Doughty, DO  oxyCODONE-acetaminophen (ROXICET) 5-325 MG tablet Take 1-2 tablets by mouth every 6 (six) hours as needed for severe pain. 08/03/16   Karamalegos, Devonne Doughty, DO  predniSONE (DELTASONE) 20 MG tablet Take 2 tablets daily (40mg ) for 4 days, take 1 tab daily (20mg )  for 4 days, take half tab daily (10mg ) for 4 days 08/03/16   Olin Hauser, DO  tiZANidine (ZANAFLEX) 4 MG capsule Take 1 capsule (4 mg total) by mouth 3 (three) times daily as needed for muscle spasms. 08/03/16   Olin Hauser, DO    Family History History reviewed. No pertinent family history.  Social History Social History  Substance Use Topics  . Smoking status: Former Research scientist (life sciences)  . Smokeless tobacco: Former Systems developer     Comment: 2 cigarettes a day  . Alcohol use 0.0 oz/week     Comment: occ     Allergies   Patient has no known allergies.   Review of  Systems Review of Systems  Musculoskeletal: Positive for myalgias.  All other systems reviewed and are negative.    Physical Exam Triage Vital Signs ED Triage Vitals  Enc Vitals Group     BP 09/06/16 1143 120/74     Pulse Rate 09/06/16 1143 73     Resp 09/06/16 1143 16     Temp 09/06/16 1143 98 F (36.7 C)     Temp Source 09/06/16 1143 Oral     SpO2 09/06/16 1143 97 %     Weight 09/06/16 1145 (!) 320 lb (145.2 kg)     Height 09/06/16 1145 5\' 6"  (1.676 m)     Head Circumference --      Peak Flow --      Pain Score 09/06/16 1145 10     Pain Loc --      Pain Edu? --      Excl. in [place]? --    No data found.   Updated Vital Signs BP 120/74 (BP Location: Right Arm)   Pulse 73   Temp 98 F (36.7 C) (Oral)   Resp 16   Ht 5\' 6"  (1.676 m)   Wt (!) 320 lb (145.2 kg)   SpO2 97%   BMI 51.65 kg/m   Visual Acuity Right Eye Distance:   Left Eye Distance:   Bilateral Distance:    Right Eye Near:   Left Eye Near:    Bilateral Near:     Physical Exam  Constitutional: He is oriented to person, place, and time. He appears well-developed and well-nourished.  HENT:  Head: Normocephalic and atraumatic.  Right Ear: External ear normal.  Left Ear: External ear normal.  Eyes: Pupils are equal, round, and reactive to light.  Neck: Normal range of motion. Neck supple.  Pulmonary/Chest: Effort normal.  Musculoskeletal: He exhibits edema and tenderness. He exhibits no deformity.       Left hand: He exhibits decreased range of motion, tenderness and swelling.       Hands: Left hand is markedly more swollen than the right hand is a small abrasion present on the left hand. There is no tenderness over either anatomical snuffbox he has good range of motion  Neurological: He is alert and oriented to person, place, and time.  Skin: Skin is warm.  Vitals reviewed.    UC Treatments / Results  Labs (all labs ordered are listed, but only abnormal results are displayed) Labs Reviewed - No  data to display  EKG  EKG Interpretation None       Radiology No results found.  Procedures Procedures (including critical care time)  Medications Ordered in UC Medications - No data to display   Initial Impression / Assessment and Plan / UC Course  I have reviewed the triage vital signs and the nursing notes.  Pertinent labs & imaging results that were available during my care of the patient were reviewed by me and considered in my medical decision making (see chart for details).    Recommend Mobic 15 mg 1 tablet a day patient feels his tetanus injections up-to-date recommend ice pack to the both hands without any tenderness over the snuffbox not worried about repeating x-rays of the hands at this time work note given for today and tomorrow as well.   Patient will be given Toradol 60 mg IM as he has a fracture of the left hand of the fifth metacarpal bone is some angulation and it is a distal fracture will give him a wrist splint placed on Mobic 15 mg and have a PCP to get an orthopedic referral  Final Clinical Impressions(s) / UC Diagnoses   Final diagnoses:  Injury of right hand, initial encounter  Contusion of right hand, initial encounter  Injury of left hand, initial encounter  Contusion of left hand including fingers, initial encounter        New Prescriptions New Prescriptions   MELOXICAM (MOBIC) 15 MG TABLET    Take 1 tablet (15 mg total) by mouth daily.     Note: This dictation was prepared with Dragon dictation along with smaller phrase technology. Any transcriptional errors that result from this process are unintentional.   Frederich Cha, MD 09/06/16 1312

## 2016-09-06 NOTE — ED Triage Notes (Signed)
Patient injured his right and left hands when he dropped a engine on them. The left hand is visibly more swollen and the most painful. Patient has a history of bilateral hand injuries when playing football in high school.

## 2016-09-06 NOTE — Discharge Instructions (Signed)
Patient will need to see his PCP in order for him to get referral to orthopedics since he does have Medicaid

## 2016-09-07 ENCOUNTER — Telehealth: Payer: Self-pay | Admitting: Family Medicine

## 2016-09-07 NOTE — Telephone Encounter (Signed)
Pt went to [facility] for broken left hand.  He needs a referral to [facility] in [place].  Leanna's call back number is [phone].

## 2016-09-08 NOTE — Telephone Encounter (Signed)
LMTCB

## 2016-09-09 ENCOUNTER — Other Ambulatory Visit: Payer: Self-pay

## 2016-09-09 DIAGNOSIS — S6292XA Unspecified fracture of left wrist and hand, initial encounter for closed fracture: Secondary | ICD-10-CM

## 2016-09-09 NOTE — Telephone Encounter (Signed)
Referral has been placed. 

## 2016-09-14 ENCOUNTER — Ambulatory Visit: Payer: Medicaid Other | Admitting: Family Medicine

## 2016-10-04 ENCOUNTER — Encounter: Payer: Self-pay | Admitting: Family Medicine

## 2016-10-04 ENCOUNTER — Ambulatory Visit (INDEPENDENT_AMBULATORY_CARE_PROVIDER_SITE_OTHER): Payer: Medicaid Other | Admitting: Family Medicine

## 2016-10-04 VITALS — BP 116/63 | HR 76 | Temp 98.4°F | Resp 16 | Ht 66.0 in | Wt 321.6 lb

## 2016-10-04 DIAGNOSIS — M79641 Pain in right hand: Secondary | ICD-10-CM | POA: Diagnosis not present

## 2016-10-04 DIAGNOSIS — M51369 Other intervertebral disc degeneration, lumbar region without mention of lumbar back pain or lower extremity pain: Secondary | ICD-10-CM

## 2016-10-04 DIAGNOSIS — G894 Chronic pain syndrome: Secondary | ICD-10-CM

## 2016-10-04 DIAGNOSIS — M5136 Other intervertebral disc degeneration, lumbar region: Secondary | ICD-10-CM | POA: Diagnosis not present

## 2016-10-04 DIAGNOSIS — L0390 Cellulitis, unspecified: Secondary | ICD-10-CM

## 2016-10-04 DIAGNOSIS — L03113 Cellulitis of right upper limb: Secondary | ICD-10-CM

## 2016-10-04 DIAGNOSIS — G8929 Other chronic pain: Secondary | ICD-10-CM | POA: Diagnosis not present

## 2016-10-04 NOTE — Progress Notes (Signed)
Subjective:    Patient ID: David Medina, male    DOB: 1980/11/16, 36 y.o.   MRN: [id number]  David Medina is a 36 y.o. 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 Brief summary of recent course, patient had symptoms of R hand pain with middle finger redness and swelling for about 2 days before presenting, did not have known injury or trauma (works as Systems developer), and has had similar infections before in this finger/hand (4-6 episodes in 2 years), this time he was hospitalized after identified extension of cellulitis up arm, had elevated CRP 14.3, and mild elevated WBC 11.7, imaging with MRI R Hand that was consistent with cellulitis with dorsal soft tissue swelling but no evidence of osteomyelitis or tenosynovitis or abscess, treated with IV antibiotic initially Vancomycin and Ceftriaxone, had blood cultures as well, consulted ID. Transitioned to PO antibiotics and discharged. - Today reports overall has done well after discharge with regards to infection, but he still has significant pain and soreness in R hand, did not seem to respond as it had in past. He has last day of Clindamycin today. - He and his wife express concern with this recurrent problem, denies any actual injury that triggers infection. He was told to wear gloves for protection to avoid re-infection. Also he states that [facility] considered referring him to [place] for second opinion but he was ultimately discharged before this could take place. - New medications on discharge: Augmentin / Cipro - Changes to current meds on discharge: None  Additional concerns discussed today: - He was last seen by me on 08/03/16 for chronic LBP and radiculopathy, he was referred to [facility] Neuro[facility] but this was declined when he agreed to stay with [facility] for his back, and was already on pain contract with them. Since then he has decided to complete therapy with [facility] and would like transition back to [facility] Neurosurgery for second opinion since not improving. He requests repeat referral now to [facility] Neurosurgery now that he has terminated his pain contract with [facility] and plans to transfer care. Additionally [facility] had ordered extensive blood test results, faxed to our office today for my review. He is asking management question on their lab results, we will discuss at future visit once all results have been received and reviewed. - Also, he has problem with L hand prior fracture, followed by [facility] Dr Jackqulyn Livings and has apt in 1 week for follow-up to remove splint on L hand   I have reviewed the discharge medication list, and have reconciled the current and discharge medications today.   Current Outpatient Prescriptions:  .  ciprofloxacin (CIPRO) 250 MG tablet, ciprofloxacin 250 mg tablet, Disp: , Rfl:  .  escitalopram (LEXAPRO) 20 MG tablet, Take 1 tablet (20 mg total) by mouth daily., Disp: 90 tablet, Rfl: 1 .  fluticasone (FLONASE) 50 MCG/ACT nasal spray, PLACE 2 SPRAYS INTO BOTH NOSTRILS DAILY., Disp: 16 g,  Rfl: 5 .  indomethacin (INDOCIN) 50 MG capsule, indomethacin 50 mg capsule  TK ONE C PO TID, Disp: , Rfl:  .  meloxicam (MOBIC) 15 MG tablet, Take 1 tablet (15 mg total) by mouth daily., Disp: 30 tablet, Rfl: 0 .  omeprazole (PRILOSEC) 20 MG capsule, TAKE 1 CAP BY  MOUTH DAILY, 30 MIN BEFORE FIRST MEAL OF DAY - TAKE FOR 1 MONTH THEN STOP IF RESOLVED, Disp: 30 capsule, Rfl: 5 .  oxyCODONE-acetaminophen (ROXICET) 5-325 MG tablet, Take 1-2 tablets by mouth every 6 (six) hours as needed for severe pain., Disp: 30 tablet, Rfl: 0 .  predniSONE (DELTASONE) 20 MG tablet, Take 2 tablets daily (40mg ) for 4 days, take 1 tab daily (20mg ) for 4 days, take half tab daily (10mg ) for 4 days, Disp: 14 tablet, Rfl: 0 .  tiZANidine (ZANAFLEX) 4 MG capsule, Take 1 capsule (4 mg total) by mouth 3 (three) times daily as needed for muscle spasms., Disp: 60 capsule, Rfl: 2 .  topiramate (TOPAMAX) 50 MG tablet, Taking 1 tab (50mg ) in morning and 2 tabs (100mg ) in evening, take dose 1 hour before meal, Disp: 90 tablet, Rfl: 2 .  amoxicillin-clavulanate (AUGMENTIN) 875-125 MG tablet, amoxicillin 875 mg-potassium clavulanate 125 mg tablet  TK 1 T PO  BID, Disp: , Rfl:   ------------------------------------------------------------------------- Social History  Substance Use Topics  . Smoking status: Former Research scientist (life sciences)  . Smokeless tobacco: Former Systems developer     Comment: 2 cigarettes a day  . Alcohol use 0.0 oz/week     Comment: occ    Review of Systems Per HPI unless specifically indicated above     Objective:    BP 116/63   Pulse 76   Temp 98.4 F (36.9 C) (Oral)   Resp 16   Ht 5\' 6"  (1.676 m)   Wt (!) 321 lb 9.6 oz (145.9 kg)   BMI 51.91 kg/m   Wt Readings from Last 3 Encounters:  10/04/16 (!) 321 lb 9.6 oz (145.9 kg)  09/06/16 (!) 320 lb (145.2 kg)  08/03/16 (!) 322 lb (146.1 kg)    Physical Exam  Constitutional: He appears well-developed and well-nourished. No distress.  Well-appearing, currently mostly comfortable some limitation due to back pain, cooperative, morbidly obese  HENT:  Head: Normocephalic and atraumatic.  Mouth/Throat: Oropharynx is clear and moist.  Eyes: Pupils are equal, round, and reactive to light. Conjunctivae are normal. Right eye exhibits no  discharge. Left eye exhibits no discharge.  Neck: Normal range of motion. Neck supple.  Cardiovascular: Normal rate, regular rhythm, normal heart sounds and intact distal pulses.   No murmur heard. Pulmonary/Chest: Effort normal and breath sounds normal. No respiratory distress. He has no wheezes. He has no rales.  Musculoskeletal: He exhibits no edema.  Right Hand/Wrist Inspection: Both hands appear dirty with oil/grime residue from work, localized healing ulceration to palmar 3rd digit over PIP, otherwise symmetrical, no bulky MCP joints, no edema or erythema. Palpation: Non tender hand / wrist, carpal bones, including MCP, base of thumb. No distinct anatomical snuff box or scaphoid tenderness. ROM: full active wrist ROM flex / ext, ulnar / radial deviation, but some pain with grip and finger extension Special Testing: Negative Tinel's median nerve Strength: 4/5 grip, intact thumb opposition, wrist flex/ext Neurovascular: distally intact  Low Back - similar to last exam 07/2016 Inspection: Normal appearance, Large body habitus, no spinal deformity, symmetrical. Palpation: No tenderness over spinous processes. Localized pain to middle of low back L4-5 and SI area in  wide region without reproducible pain on palpable, surrounding paraspinal muscles hypertonicity and spasm ROM: Some limited active ROM forward flex / back extension Special Testing: Seated SLR positive for radicular pain bilaterally  Strength: Bilateral hip flex/ext 5/5, knee flex/ext 5/5, ankle dorsiflex/plantarflex 5/5 Neurovascular: intact distal sensation to light touch  Neurological: He is alert.  Distal sensation to light touch intact, only mild reduced bilateral hands  Skin: Skin is warm and dry. No rash noted. He is not diaphoretic. No erythema.  Psychiatric: He has a normal mood and affect. His behavior is normal.  Well groomed, good eye contact, normal speech and thoughts. Does not appear anxious.  Nursing note and  vitals reviewed.   I have personally reviewed the radiology report from 09/26/16 R Hand MRI [facility])  EXAM: MRI UPPER EXTREMITY NON-JOINT RIGHT W WO CONTRAST DATE: 09/26/2016 4:52 PM ACCESSION: [id number] UN DICTATED: 09/26/2016 4:57 PM INTERPRETATION LOCATION: [place]  CLINICAL INDICATION: 36 years old Male with right hand cellulitis--   COMPARISON: Radiograph 09/26/2016.  TECHNIQUE: MRI of the right hand was performed before and after administration of IV contrast using a local coil. Multisequence, multiplanar imaging was performed.  FINDINGS: Inhomogeneous fat suppression which decreases sensitivity of the distal aspect of the second through fifth digits. There is susceptibility artifact along the distal long finger suggesting postsurgical change or metallic body.  Bones: The bone marrow signal intensity is otherwise within normal limits. No fracture line or bone marrow edema. The joint spaces are maintained. No erosion or periosteal edema.  Tendons: The extensor and flexor tendons are intact. No circumferential fluid involving the extensor tendons.  Soft Tissues: Diffuse skin thickening and edema involving the dorsal aspect of the hand and circumferential involvement of the long finger. The muscles are otherwise normal in morphology and signal intensity. No rim-enhancing fluid collection.   IMPRESSION: - Dorsal right hand soft tissue swelling and edema extending into the right long finger which can be seen in cellulitis. - No MRI evidence for acute osteomyelitis or tenosynovitis.   Results for orders placed or performed in visit on 10/04/16  HIV antibody  Result Value Ref Range   HIV 1&2 Ab, 4th Generation Not detected   CBC and differential  Result Value Ref Range   Hemoglobin 14.5 13.5 - 17.5   HCT 43 41 - 53   Neutrophils Absolute 10    Platelets 217 150 - 399   WBC 14.3   VITAMIN D 25 Hydroxy (Vit-D Deficiency, Fractures)  Result Value Ref Range   Vit D, 25-Hydroxy 28    Basic metabolic panel  Result Value Ref Range   Glucose 90    BUN 12 4 - 21   Creatinine 0.9 0.6 - 1.3   Potassium 3.5 3.4 - 5.3   Sodium 140 137 - 147  Hepatic function panel  Result Value Ref Range   Alkaline Phosphatase 58 25 - 125   ALT 21 10 - 40   AST 19 14 - 40   Bilirubin, Direct 0.1 0.01 - 0.4   Bilirubin, Total 0.3   Vitamin B12  Result Value Ref Range   Vitamin B-12 412   TSH  Result Value Ref Range   TSH 1.87 0.41 - 5.90  Heavy Metals Panel, Blood  Result Value Ref Range   Mercury - Blood None detected    Arsenic 4 2 - 23  C-reactive protein  Result Value Ref Range   CRP 7.1 (A) 0.0 - 4.9  Uric acid  Result Value Ref  Range   Uric Acid 6.8 3.7 - 8.6  Cyclic citrul peptide antibody, IgG  Result Value Ref Range   CCP Antibodies IgG/IgA 5 0 - 19  Lyme Ab, Total/IgM Responses  Result Value Ref Range   Lyme IgG/IgM Ab <0.91 0 - 0.90  Vitamin B1  Result Value Ref Range   Vitamin B1 (Thiamine) 165 66.5 - 200  Rocky mtn spotted fvr ab, IgG-blood  Result Value Ref Range   Rocky Mt Spotted Fever IgG Negative   IGF-BP1  Result Value Ref Range   IGF-BP1 86 (A) 88 - 246  Vitamin B6  Result Value Ref Range   Vitamin B6 47.8 (A) 5.3 - 46.7  DHEA-Sulfate, Serum  Result Value Ref Range   DHEA 88 31 - 701  Testosterone,Free and Total  Result Value Ref Range   Testosterone, Serum 117 (A) 264 - 916   Free Testosterone(Direct) 2.0 (A) 8.7 - 25.1  Thyroid Profile  Result Value Ref Range   TSH 1.870    Thyroxine (T4) 7.0 4.5 - 12   T3 Uptake 24 24 - 39   Free Thyroxine Index 1.7 1.2 - 4.9  ANA Comprehensive Panel  Result Value Ref Range   ANA by IFA Negative    Anti-DNA (DS) Ab Qn 1 0 - 9   Sjogren's Anti-SS-A <0.2 0 - 0.9   Sjogren's Anti-SS-B <0.2 0 - 0.9   RNP Antibodies <0.2 0 - 0.9   Smith Antibodies <0.2 0 - 0.9  Protein electrophoresis  Result Value Ref Range   Albumin 3.8 2.9 - 4.4   Alpha-1-Globulin 0.2 0 - 0.4   Alpha-2-Globulin 0.7 0.4 - 1    Beta Globulin 0.9 0.7 - 1.3   Gamma Globulin 1.1 0.4 - 1.8   M-SPIKE, % Not observed    Globulin, Total 3.0 2.2 - 3.9   Albumin/Globulin Ratio 1.3 0.7 - 1.7  POCT blood Lead  Result Value Ref Range   Lead, POC none   POCT erythrocyte sed rate, Non-automated  Result Value Ref Range   Sed Rate 18       Assessment & Plan:   Problem List Items Addressed This Visit    Degenerative disc disease, lumbar   Relevant Medications   indomethacin (INDOCIN) 50 MG capsule   Chronic pain syndrome    Other Visit Diagnoses    Cellulitis of right hand    -  Primary   Relevant Orders   Ambulatory referral to Orthopedic Surgery   Chronic hand pain, right       Relevant Medications   indomethacin (INDOCIN) 50 MG capsule   Other Relevant Orders   Ambulatory referral to Orthopedic Surgery   Recurrent cellulitis       Relevant Orders   Ambulatory referral to Orthopedic Surgery      R Hand Recurrent Infection / S/p Cellulitis: Resolved cellulitis, with finishing PO antibiotics now, s/p IV in hospital Uncertain exact etiology, 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 - Agree with request for Hand Orthopedic evaluation, prefer [facility] as recommended from hospitalization and referral placed today, he will finish seeing [facility] for prior fracture on L hand - Ultimately he may even need second opinion from [facility] Infectious Disease regarding these infections, but given his multitude of orthopedic complaints will start with Ortho for more structural  eval with his persistent pain in R hand  Received fax today 8/7 with extensive lab results from [facility], will review and abstract, discuss at follow-up apt, as these are unrelated items to current hospital follow-up.  Lastly have re-opened  referral to [facility] Neurosurgery at patient request, see detailed office note from 08/03/16 last visit with me with concerns for his Lumbar Spine and chronic back pain. Now he will be transitioning his care from [facility].  Meds ordered this encounter  Medications  . amoxicillin-clavulanate (AUGMENTIN) 875-125 MG tablet    Sig: amoxicillin 875 mg-potassium clavulanate 125 mg tablet  TK 1 T PO  BID  . ciprofloxacin (CIPRO) 250 MG tablet    Sig: ciprofloxacin 250 mg tablet  . indomethacin (INDOCIN) 50 MG capsule    Sig: indomethacin 50 mg capsule  TK ONE C PO TID    Follow up plan: Return in about 4 weeks (around 11/01/2016) for Lab result review / follow-up Weight / TSH / Vit D / Testosterone.  Nobie Putnam, [facility] 10/04/2016, 10:43 PM

## 2016-10-04 NOTE — Patient Instructions (Addendum)
Thank you for coming to the clinic today.  1.  As discussed, resent referral to [facility] Neurosurgery and Pain Management  [facility] Pain Management and Neurosurgery [facility] [address] [place], [place]  [id number] Appointments: [phone]  Also referral to [place] for R hand chronic recurrent infection and pain > MAY NEED INFECTIOUS DISEASE referral for recurrent infection, this can be placed by [facility] Ortho doctor  ------ Keep apt for L hand 10/13/16   David Medina, David Medina   [place] [address]   Phone: [phone]  --------------------------------------------  We will review lab results from Ortho and contact you to discuss them  Please schedule a Follow-up Appointment to: Return in about 4 weeks (around 11/01/2016) for Lab result review / follow-up Weight / TSH / Vit D / Testosterone.  If you have any other questions or concerns, please feel free to call the clinic or send a message through [place]. You may also schedule an earlier appointment if necessary.  Additionally, you may be receiving a survey about your experience at our clinic within a few days to 1 week by e-mail or mail. We value your feedback.  Nobie Putnam, DO [place]

## 2016-10-07 ENCOUNTER — Telehealth: Payer: Self-pay

## 2016-10-07 DIAGNOSIS — G894 Chronic pain syndrome: Secondary | ICD-10-CM

## 2016-10-07 DIAGNOSIS — M792 Neuralgia and neuritis, unspecified: Secondary | ICD-10-CM

## 2016-10-07 DIAGNOSIS — M5136 Other intervertebral disc degeneration, lumbar region: Secondary | ICD-10-CM

## 2016-10-07 MED ORDER — OXYCODONE-ACETAMINOPHEN 5-325 MG PO TABS: 1.0000 | ORAL_TABLET | Freq: Four times a day (QID) | ORAL | 0 refills | Status: DC | PRN

## 2016-10-07 NOTE — Telephone Encounter (Addendum)
Spoke with Frederich Cha CMA, she has contacted patient and confirmed that they have scheduled apt for [facility] Hand ortho below, 8/23, and reminded them as also recently reminded in office, that in order to transfer records from [facility] to [facility] Neurosurgery to establish care they need to coordinate that with [facility]. We have already contacted [facility] Neurosurgery and they do not require any other referral information from [place], they just need the records that we do not have access to.  Checked [place] CSRS for past 6 months. Last rx opiate was approx 5 day supply rx on 09/27/16 at time of discharge from [facility].  Printed rx Oxycodone/Acetaminophen percocet 5/325mg  take 1-2 tabs q 6 hr PRN #40 and 0 refills. This rx is ready for pick up at front office. I agreed for bridging opiate pain medicine temporarily until he can follow-up in future with specialists. The next apt in 8/23 is for hand, however this is not source of his chronic pain. He needs to establish with [facility] Neurosurgery ultimately. I do not plan to refill this rx before his next apt, but may consider another bridge rx if he has an apt scheduled with [facility] Neurosurgery.  Nobie Putnam, [facility] 10/07/2016, 11:52 AM

## 2016-10-07 NOTE — Telephone Encounter (Signed)
Patients wife called reporting that David Medina has an Ortho appointment on 10/20/16 and they told him to contact his pcp for pain meds until that appointment.  Please advise if patient needs an appointment

## 2016-10-24 ENCOUNTER — Other Ambulatory Visit: Payer: Self-pay | Admitting: Family Medicine

## 2016-10-24 DIAGNOSIS — M5136 Other intervertebral disc degeneration, lumbar region: Secondary | ICD-10-CM

## 2016-10-24 DIAGNOSIS — M792 Neuralgia and neuritis, unspecified: Secondary | ICD-10-CM

## 2016-10-24 DIAGNOSIS — G894 Chronic pain syndrome: Secondary | ICD-10-CM

## 2016-11-24 ENCOUNTER — Ambulatory Visit: Payer: Self-pay | Admitting: Family Medicine

## 2016-11-26 ENCOUNTER — Other Ambulatory Visit: Payer: Self-pay | Admitting: Family Medicine

## 2016-11-26 DIAGNOSIS — G894 Chronic pain syndrome: Secondary | ICD-10-CM

## 2016-11-26 DIAGNOSIS — M792 Neuralgia and neuritis, unspecified: Secondary | ICD-10-CM

## 2016-11-28 ENCOUNTER — Encounter: Payer: Self-pay | Admitting: Family Medicine

## 2016-11-28 ENCOUNTER — Telehealth: Payer: Self-pay | Admitting: Family Medicine

## 2016-11-28 ENCOUNTER — Ambulatory Visit (INDEPENDENT_AMBULATORY_CARE_PROVIDER_SITE_OTHER): Payer: Medicaid Other | Admitting: Family Medicine

## 2016-11-28 VITALS — BP 89/57 | HR 71 | Temp 98.0°F | Resp 16 | Ht 66.0 in | Wt 326.0 lb

## 2016-11-28 DIAGNOSIS — K429 Umbilical hernia without obstruction or gangrene: Secondary | ICD-10-CM | POA: Insufficient documentation

## 2016-11-28 NOTE — Assessment & Plan Note (Signed)
Consistent with acute new onset umbilical hernia - Seems to be stable and spontaneously reducible without evidence of incarceration - No acute red flag symptoms (without nausea vomiting, bowel obstruction, systemic illness, uncontrolled pain) - No prior abdominal surgery or prior hernia repair  Plan: 1. Reassurance given with mild reducible intermittent hernia 2. Recommend conservative therapy limit provoking activities and avoid heavy lifting, try to rest supine and trendelenburg if bulging, may use topical ice packs, can consider abdominal binder if needed or more regularly if worsening, can use Tylenol PRN 3. Strict return criteria and when to go to hospital ED for more acute evaluation if any significant worsening, constant pain, systemic symptoms, or potential incarceration bulge that does not reduce 4. Follow-up as needed - will plan to refer to General Surgery when patient has medicaid card switched to list me as PCP again, he is requesting consultation for 2nd opinion and possible elective repair in future

## 2016-11-28 NOTE — Telephone Encounter (Signed)
Patient seen in office today by me 11/28/16, see note for details. Regarding a separate concern he requested a refill on the previous Oxycodone rx for his chronic low back pain and lumbar DDD, briefly he has complex history with chronic pain and joint problems specifically lower back / spine, see prior notes for specific diagnoses and clinical details. He was followed by [facility] for spine and pain management, and then due to insurance change could no longer follow them, and he was referred by me to second opinion Neurosurgery - at [facility], this was originally done in June 2018, however he did not proceed with [facility] Neurosurgery referral at that time until August 2018 when this referral was re-opened. All of my records for this patient including imaging were faxed to [facility] promptly and he has been waiting for [facility] to send their records to [facility]. He was advised on this on several occasions, specifically last telephone call 10/07/16, his wife was notified of this information.  Update now, 11/28/16 they still have not completed the records request process from [facility], he is unsure of the status of his, and his wife has reported to our office that she has tried but having difficulties getting the records.  Today after patient visit, Frederich Cha CMA has contacted [facility] Neurosurgery to check status, essentially it is unchanged, they will schedule patient for new apt once they receive [facility] records. It is up to the patient to obtain release for these records to be sent to [facility]. Patient's wife was contacted by [place] again today, and notified of the same situation from August, that she needs to contact [facility] and obtain records. We offered to also make a courtesy call to [facility] to see if we can assist and possibly request some of the records as well to provide her, will stay tuned.  Also, regarding the request for refill of Oxycodone until his apt with [facility]. I have  declined this, since it has now been 4 months since initial referral, and 2 months since re-opened referral. Since they have not scheduled apt and not made progress with records, I am not willing to continue chronic pain management. I would consider a temporary rx in future if he is scheduled with [facility], but this would be a consideration only at that time.  Nobie Putnam, DO [facility] 11/28/2016, 12:34 PM

## 2016-11-28 NOTE — Progress Notes (Signed)
Subjective:    Patient ID: David [facility], male    DOB: 1980-04-18, 36 y.o.   MRN: [id number]  David [facility]ina is a 36 y.o. male presenting on 11/28/2016 for Hernia   HPI   UMBILICAL HERNIA: - Reports new problem, with umbilical hernia, first noticed it recently several days ago while "cleaning belly button" felt a "lump" and something pushing out, felt like hernia, and he had difficulty reaching back of his belly button. He does not recall any injury or anything that happened to trigger this. He feels that it "changes sometimes" and "moves" depending on positions, it seems to improve if laying down. - No prior abdominal surgery or anything involving umbilicus - Denies any pain, redness, radiation of pain, inguinal hernias, constipation, straining, coughing  Additional history - He has been followed by [facility], with numbness and tingling in R hand with prior recurrent infections and injury, now awaiting hand nerve conduction study to be re-scheduled - Additionally, he has chronic pain syndrome with lumbar DJD and chronic low back pain, has been seen for this multiple times, he was referred to [facility] 09/2016 and is still waiting on new patient appointment, they have all of our records, but were just waiting on them to send records from [facility]. Patient does not have updates today other than no new appointment yet.  Social History - He has received new medicaid card, and it has a different PCP on it, now he cannot get new referral, he will need to change this before new referrals  Depression screen David [facility] 2/9 11/28/2016 02/16/2016  Decreased Interest 0 2  Down, Depressed, Hopeless 0 2  PHQ - 2 Score 0 4  Altered sleeping - 2  Tired, decreased energy - 2  Change in appetite - 3  Feeling bad or failure about yourself  - 3  Trouble concentrating - 3  Moving slowly or fidgety/restless - 2  Suicidal thoughts - 1  PHQ-9 Score - 20  Difficult doing work/chores -  Somewhat difficult    Social History  Substance Use Topics  . Smoking status: Former Research scientist (life sciences)  . Smokeless tobacco: Former Systems developer     Comment: 2 cigarettes a day  . Alcohol use 0.0 oz/week     Comment: occ    Review of Systems Per HPI unless specifically indicated above     Objective:    BP (!) 89/57   Pulse 71   Temp 98 F (36.7 C) (Oral)   Resp 16   Ht 5\' 6"  (1.676 m)   Wt (!) 326 lb (147.9 kg)   BMI 52.62 kg/m   Wt Readings from Last 3 Encounters:  11/28/16 (!) 326 lb (147.9 kg)  10/04/16 (!) 321 lb 9.6 oz (145.9 kg)  09/06/16 (!) 320 lb (145.2 kg)    Physical Exam  Constitutional: He is oriented to person, place, and time. He appears well-developed and well-nourished. No distress.  Well-appearing, comfortable, cooperative, obese  HENT:  Head: Normocephalic and atraumatic.  Mouth/Throat: Oropharynx is clear and moist.  Eyes: Conjunctivae are normal. Right eye exhibits no discharge. Left eye exhibits no discharge.  Cardiovascular: Normal rate.   Pulmonary/Chest: Effort normal.  Abdominal: Soft. Bowel sounds are normal. He exhibits no distension and no mass. There is no tenderness. There is no rebound and no guarding.  Visible defect of umbilicus on exam lower R portion approx 8 to 5 o'clock. Palpable laxity in this region, and a fullness. On increased abdominal pressure with  patient sitting up significant herniation is palpated through this defect and it resolves spontaneously. Non tender. No incarceration.  Musculoskeletal: He exhibits no edema.  Neurological: He is alert and oriented to person, place, and time.  Skin: Skin is warm and dry. No rash noted. He is not diaphoretic. No erythema.  Psychiatric: He has a normal mood and affect. His behavior is normal.  Well groomed, good eye contact, normal speech and thoughts  Nursing note and vitals reviewed.  Results for orders placed or performed in visit on 10/04/16  HIV antibody  Result Value Ref Range   HIV 1&2 Ab,  4th Generation Not detected   CBC and differential  Result Value Ref Range   Hemoglobin 14.5 13.5 - 17.5   HCT 43 41 - 53   Neutrophils Absolute 10    Platelets 217 150 - 399   WBC 14.3   VITAMIN D 25 Hydroxy (Vit-D Deficiency, Fractures)  Result Value Ref Range   Vit D, 25-Hydroxy 28   Basic metabolic panel  Result Value Ref Range   Glucose 90    BUN 12 4 - 21   Creatinine 0.9 0.6 - 1.3   Potassium 3.5 3.4 - 5.3   Sodium 140 137 - 147  Hepatic function panel  Result Value Ref Range   Alkaline Phosphatase 58 25 - 125   ALT 21 10 - 40   AST 19 14 - 40   Bilirubin, Direct 0.1 0.01 - 0.4   Bilirubin, Total 0.3   Vitamin B12  Result Value Ref Range   Vitamin B-12 412   TSH  Result Value Ref Range   TSH 1.87 0.41 - 5.90  Heavy Metals Panel, Blood  Result Value Ref Range   Mercury - Blood None detected    Arsenic 4 2 - 23  C-reactive protein  Result Value Ref Range   CRP 7.1 (A) 0.0 - 4.9  Uric acid  Result Value Ref Range   Uric Acid 6.8 3.7 - 8.6  Cyclic citrul peptide antibody, IgG  Result Value Ref Range   CCP Antibodies IgG/IgA 5 0 - 19  Lyme Ab, Total/IgM Responses  Result Value Ref Range   Lyme IgG/IgM Ab <0.91 0 - 0.90  Vitamin B1  Result Value Ref Range   Vitamin B1 (Thiamine) 165 66.5 - 200  Rocky mtn spotted fvr ab, IgG-blood  Result Value Ref Range   Rocky Mt Spotted Fever IgG Negative   IGF-BP1  Result Value Ref Range   IGF-BP1 86 (A) 88 - 246  Vitamin B6  Result Value Ref Range   Vitamin B6 47.8 (A) 5.3 - 46.7  DHEA-Sulfate, Serum  Result Value Ref Range   DHEA 88 31 - 701  Testosterone,Free and Total  Result Value Ref Range   Testosterone, Serum 117 (A) 264 - 916   Free Testosterone(Direct) 2.0 (A) 8.7 - 25.1  Thyroid Profile  Result Value Ref Range   TSH 1.870    Thyroxine (T4) 7.0 4.5 - 12   T3 Uptake 24 24 - 39   Free Thyroxine Index 1.7 1.2 - 4.9  ANA Comprehensive Panel  Result Value Ref Range   ANA by IFA Negative    Anti-DNA  (DS) Ab Qn 1 0 - 9   Sjogren's Anti-SS-A <0.2 0 - 0.9   Sjogren's Anti-SS-B <0.2 0 - 0.9   RNP Antibodies <0.2 0 - 0.9   Smith Antibodies <0.2 0 - 0.9  Protein electrophoresis  Result Value Ref Range  Albumin 3.8 2.9 - 4.4   Alpha-1-Globulin 0.2 0 - 0.4   Alpha-2-Globulin 0.7 0.4 - 1   Beta Globulin 0.9 0.7 - 1.3   Gamma Globulin 1.1 0.4 - 1.8   M-SPIKE, % Not observed    Globulin, Total 3.0 2.2 - 3.9   Albumin/Globulin Ratio 1.3 0.7 - 1.7  POCT blood Lead  Result Value Ref Range   Lead, POC none   POCT erythrocyte sed rate, Non-automated  Result Value Ref Range   Sed Rate 18       Assessment & Plan:   Problem List Items Addressed This Visit    Umbilical hernia without obstruction and without gangrene - Primary    Consistent with acute new onset umbilical hernia - Seems to be stable and spontaneously reducible without evidence of incarceration - No acute red flag symptoms (without nausea vomiting, bowel obstruction, systemic illness, uncontrolled pain) - No prior abdominal surgery or prior hernia repair  Plan: 1. Reassurance given with mild reducible intermittent hernia 2. Recommend conservative therapy limit provoking activities and avoid heavy lifting, try to rest supine and trendelenburg if bulging, may use topical ice packs, can consider abdominal binder if needed or more regularly if worsening, can use Tylenol PRN 3. Strict return criteria and when to go to [facility] for more acute evaluation if any significant worsening, constant pain, systemic symptoms, or potential incarceration bulge that does not reduce 4. Follow-up as needed - will plan to refer to General Surgery when patient has medicaid card switched to list me as PCP again, he is requesting consultation for 2nd opinion and possible elective repair in future         No orders of the defined types were placed in this encounter.   Follow up plan: Return in about 3 months (around 02/28/2017) for Back Pain /  Hernia.  Nobie Putnam, [facility] 11/28/2016, 9:09 AM

## 2016-11-28 NOTE — Patient Instructions (Addendum)
Thank you for coming to the clinic today.  1. You do have an Umbilical Hernia  This is caused by a weakness in your abdominal or groin muscles, and is caused by bowel or fatty tissue pushing through this weak spot causing pain and bulging.  If worsening try to find positions that give you most relief, likely laying down with head lower than body will allow the hernia bulging to go back into place and feel better.  May take Tylenol as needed. Recommend to start taking Tylenol Extra Strength 500mg  tabs - take 1 to 2 tabs per dose (max 1000mg ) every 6-8 hours for pain (take regularly, don't skip a dose for next 7 days), max 24 hour daily dose is 6 tablets or 3000mg . In the future you can repeat the same everyday Tylenol course for 1-2 weeks at a time.   Can try topical ice packs or muscle rub if burning nerve sensation.  If significant worsening pain or you get bulging that does NOT go down or go away and CANNOT push back in, or nausea, vomiting, then it is very important to go directly to hospital ED for more immediate evaluation, as this can be a life threatening surgical emergency  Will wait until finalized Medicaid card change before actual referral  [facility] [address] [place],  [address] Hours: 8:30am to 5pm (M-F) Ph [phone]  Please schedule a Follow-up Appointment to: Return in about 3 months (around 02/28/2017) for Back Pain / Hernia.  If you have any other questions or concerns, please feel free to call the clinic or send a message through [facility]. You may also schedule an earlier appointment if necessary.  Additionally, you may be receiving a survey about your experience at our clinic within a few days to 1 week by e-mail or mail. We value your feedback.  Nobie Putnam, DO [place]

## 2017-01-19 ENCOUNTER — Other Ambulatory Visit: Payer: Self-pay | Admitting: Family Medicine

## 2017-01-19 DIAGNOSIS — F418 Other specified anxiety disorders: Secondary | ICD-10-CM

## 2017-01-19 DIAGNOSIS — F5105 Insomnia due to other mental disorder: Secondary | ICD-10-CM

## 2017-03-01 ENCOUNTER — Encounter: Payer: Self-pay | Admitting: General Surgery

## 2017-04-04 ENCOUNTER — Ambulatory Visit: Payer: Self-pay | Admitting: General Surgery

## 2017-04-05 ENCOUNTER — Encounter: Payer: Self-pay | Admitting: *Deleted

## 2017-04-13 ENCOUNTER — Encounter: Payer: Self-pay | Admitting: General Surgery

## 2017-04-21 ENCOUNTER — Other Ambulatory Visit: Payer: Self-pay | Admitting: Family Medicine

## 2017-04-21 DIAGNOSIS — F418 Other specified anxiety disorders: Secondary | ICD-10-CM

## 2017-04-21 DIAGNOSIS — F5105 Insomnia due to other mental disorder: Secondary | ICD-10-CM

## 2017-04-23 MED ORDER — ESCITALOPRAM OXALATE 20 MG PO TABS: 20.0000 mg | ORAL_TABLET | Freq: Every day | ORAL | 1 refills | Status: DC

## 2017-05-18 ENCOUNTER — Ambulatory Visit: Payer: Self-pay | Admitting: General Surgery

## 2017-05-25 ENCOUNTER — Ambulatory Visit: Payer: Self-pay | Admitting: General Surgery

## 2017-05-31 ENCOUNTER — Encounter: Payer: Self-pay | Admitting: *Deleted

## 2017-08-15 ENCOUNTER — Ambulatory Visit: Payer: Self-pay | Admitting: General Surgery

## 2017-08-16 ENCOUNTER — Encounter: Payer: Self-pay | Admitting: *Deleted

## 2018-05-03 ENCOUNTER — Other Ambulatory Visit: Payer: Self-pay

## 2018-05-03 ENCOUNTER — Encounter: Payer: Self-pay | Admitting: General Surgery

## 2018-05-03 ENCOUNTER — Ambulatory Visit (INDEPENDENT_AMBULATORY_CARE_PROVIDER_SITE_OTHER): Payer: Self-pay | Admitting: General Surgery

## 2018-05-03 VITALS — BP 144/90 | HR 94 | Temp 97.7°F | Resp 18 | Ht 66.0 in | Wt 298.6 lb

## 2018-05-03 DIAGNOSIS — K429 Umbilical hernia without obstruction or gangrene: Secondary | ICD-10-CM

## 2018-05-03 NOTE — Progress Notes (Signed)
Patient ID: David [facility], male   DOB: [birth year], 38 y.o.   MRN: [id number]  Chief Complaint  Patient presents with  . New Patient (Initial Visit)    umbilical hernia    HPI Raekwan Spelman is a 38 y.o. male here today for possible umbilical hernia patients wife, who he called on the phone states he has had it for 1 year. He was referred by Elisabeth Cara NP at [place] [facility]. Patient states he has recently had some pain associated with the hernia, since he has been moving into a his own shop.   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  HPI  Past Medical History:  Diagnosis Date  . DDD (degenerative disc disease), cervical   . Depression     Past Surgical History:  Procedure Laterality Date  . denies      Family History  Problem Relation Age of Onset  . Heart disease Other     Social History Social History   Tobacco Use  . Smoking status: Current Some Day Smoker    Packs/day: 0.25    Years: 2.00    Pack years: 0.50    Types: Cigarettes  . Smokeless tobacco: Former Systems developer  . Tobacco comment: 2 cigarettes a day  Substance Use Topics  . Alcohol use: Yes    Alcohol/week: 0.0 standard drinks    Comment: occ  . Drug use: No    Allergies  Allergen Reactions  . Cyclobenzaprine Rash  . Tramadol Swelling and Hives  . Amoxicillin Rash    Current Outpatient Medications  Medication Sig Dispense Refill  . escitalopram (LEXAPRO) 20 MG tablet Take 1 tablet (20 mg total) by mouth daily. 90 tablet 1  . fluticasone (FLONASE) 50 MCG/ACT nasal spray PLACE 2 SPRAYS INTO BOTH NOSTRILS DAILY. 16 g 5  . indomethacin (INDOCIN) 50 MG capsule indomethacin 50 mg capsule  TK ONE C PO TID    . meloxicam (MOBIC) 15 MG tablet Take 1 tablet (15 mg total) by mouth daily. 30 tablet 0  . omeprazole (PRILOSEC) 20 MG capsule TAKE 1 CAP BY MOUTH DAILY, 30 MIN BEFORE FIRST MEAL OF DAY - TAKE FOR 1 MONTH THEN STOP IF RESOLVED 30 capsule 5  . tiZANidine (ZANAFLEX) 4 MG  tablet TAKE 1 CAPSULE (4 MG TOTAL) BY MOUTH 3 (THREE) TIMES DAILY AS NEEDED FOR MUSCLE SPASMS. 60 tablet 2  . topiramate (TOPAMAX) 50 MG tablet TAKING 1 TAB (50MG ) IN MORNING AND 2 TABS (100MG ) IN EVENING, TAKE DOSE 1 HOUR BEFORE MEAL 90 tablet 2   No current facility-administered medications for this visit.     Review of Systems Review of Systems  Constitutional: Negative.   Respiratory: Negative.   Cardiovascular: Negative.     Blood pressure (!) 144/90, pulse 94, temperature 97.7 F (36.5 C), temperature source Temporal, resp. rate 18, height 5\' 6"  (1.676 m), weight 298 lb 9.6 oz (135.4 kg), SpO2 99 %.  Physical Exam Physical Exam Constitutional:      Appearance: He is well-developed.  HENT:     Mouth/Throat:     Pharynx: No oropharyngeal exudate.  Eyes:     General: No scleral icterus.    Conjunctiva/sclera: Conjunctivae normal.  Neck:     Musculoskeletal: Normal range of motion and neck supple.  Cardiovascular:     Rate and Rhythm: Normal rate and regular rhythm.     Heart sounds: Normal heart sounds.  Pulmonary:     Effort: Pulmonary effort is  normal.     Breath sounds: Normal breath sounds.  Chest:     Breasts: Breasts are symmetrical.   Abdominal:     General: Bowel sounds are normal.     Palpations: Abdomen is soft.     Hernia: A hernia is present. Hernia is present in the ventral area. There is no hernia in the umbilical area.    Lymphadenopathy:     Cervical: No cervical adenopathy.  Skin:    General: Skin is warm and dry.  Neurological:     Mental Status: He is alert and oriented to person, place, and time.  Psychiatric:        Behavior: Behavior normal.     Data Reviewed Hemoglobin of August 22, 2016 was 14.5.  Platelet count 217,000.  White blood cell count 14,300.  Assessment Ventral hernia with incarcerated omentum.  Plan  The patient is aware to call back for any questions or new concerns.  Hernia precautions and incarceration were  discussed with the patient. If they develop symptoms of an incarcerated hernia, they were encouraged to seek prompt medical attention.  I have recommended repair of the hernia using mesh on an outpatient basis in the near future. The risk of infection was reviewed. The role of prosthetic mesh to minimize the risk of recurrence was reviewed.  The role of mesh to provide adequate support for a suspected 3+ centimeters defect was reviewed.   HPI, assessment, plan and physical exam has been scribed under the direction and in the presence of Robert Bellow, MD. Karie Fetch, RN  HPI, Physical Exam, Assessment and Plan have been scribed under the direction and in the presence of Hervey Ard, Md.  Eudelia Bunch R. Bobette Mo, CMA  I have completed the exam and reviewed the above documentation for accuracy and completeness.  I agree with the above.  Haematologist has been used and any errors in dictation or transcription are unintentional.  Hervey Ard, M.D., F.A.C.S.  Forest Gleason Byrnett 05/04/2018, 2:57 PM  Patient's surgery has been scheduled for 06-01-18 at [facility] with Dr. Bary Castilla. We will try and have Arvilla Meres, RN assist with this case.   The patient is aware he will be contacted by the [facility] to complete a phone interview sometime in the near future.  The patient is aware to call the office should he have further questions.   Dominga Ferry, CMA

## 2018-05-03 NOTE — Patient Instructions (Addendum)
The patient is aware to call back for any questions or new concerns.    Umbilical Hernia, Adult  A hernia is a bulge of tissue that pushes through an opening between muscles. An umbilical hernia happens in the abdomen, near the belly button (umbilicus). The hernia may contain tissues from the small intestine, large intestine, or fatty tissue covering the intestines (omentum). Umbilical hernias in adults tend to get worse over time, and they require surgical treatment. There are several types of umbilical hernias. You may have:  A hernia located just above or below the umbilicus (indirect hernia). This is the most common type of umbilical hernia in adults.  A hernia that forms through an opening formed by the umbilicus (direct hernia).  A hernia that comes and goes (reducible hernia). A reducible hernia may be visible only when you strain, lift something heavy, or cough. This type of hernia can be pushed back into the abdomen (reduced).  A hernia that traps abdominal tissue inside the hernia (incarcerated hernia). This type of hernia cannot be reduced.  A hernia that cuts off blood flow to the tissues inside the hernia (strangulated hernia). The tissues can start to die if this happens. This type of hernia requires emergency treatment. What are the causes? An umbilical hernia happens when tissue inside the abdomen presses on a weak area of the abdominal muscles. What increases the risk? You may have a greater risk of this condition if you:  Are obese.  Have had several pregnancies.  Have a buildup of fluid inside your abdomen (ascites).  Have had surgery that weakens the abdominal muscles. What are the signs or symptoms? The main symptom of this condition is a painless bulge at or near the belly button. A reducible hernia may be visible only when you strain, lift something heavy, or cough. Other symptoms may include:  Dull pain.  A feeling of pressure. Symptoms of a strangulated  hernia may include:  Pain that gets increasingly worse.  Nausea and vomiting.  Pain when pressing on the hernia.  Skin over the hernia becoming red or purple.  Constipation.  Blood in the stool. How is this diagnosed? This condition may be diagnosed based on:  A physical exam. You may be asked to cough or strain while standing. These actions increase the pressure inside your abdomen and force the hernia through the opening in your muscles. Your health care provider may try to reduce the hernia by pressing on it.  Your symptoms and medical history. How is this treated? Surgery is the only treatment for an umbilical hernia. Surgery for a strangulated hernia is done as soon as possible. If you have a small hernia that is not incarcerated, you may need to lose weight before having surgery. Follow these instructions at home:  Lose weight, if told by your health care provider.  Do not try to push the hernia back in.  Watch your hernia for any changes in color or size. Tell your health care provider if any changes occur.  You may need to avoid activities that increase pressure on your hernia.  Do not lift anything that is heavier than 10 lb (4.5 kg) until your health care provider says that this is safe.  Take over-the-counter and prescription medicines only as told by your health care provider.  Keep all follow-up visits as told by your health care provider. This is important. Contact a health care provider if:  Your hernia gets larger.  Your hernia becomes painful. Get  help right away if:  You develop sudden, severe pain near the area of your hernia.  You have pain as well as nausea or vomiting.  You have pain and the skin over your hernia changes color.  You develop a fever. This information is not intended to replace advice given to you by your health care provider. Make sure you discuss any questions you have with your health care provider. Document Released:  07/17/2015 Document Revised: 03/29/2017 Document Reviewed: 08/15/2016 Elsevier Interactive Patient Education  2019 [employer].

## 2018-05-17 ENCOUNTER — Telehealth: Payer: Self-pay

## 2018-05-17 NOTE — Telephone Encounter (Signed)
Call to patient to inform of need to post pone all elective surgery cases. I spoke with his wife and she states that he has been in severe pain from the hernia for the past 2 days. He has also had nausea with this. She states that the hernia is very bulged out and that he had severe pain when she tried to reduce it last night. I spoke with Dr Bary Castilla and informed of what was said and he would like for the patient to come in to be seen by him today. I have left a message for the patient to call back to be seen in office today.

## 2018-05-17 NOTE — Telephone Encounter (Signed)
Spoke with patient at this time. He states he is not going to leave work to be seen today. He stated he is not in severe pain and the nausea is better and that he cannot afford to leave work.  He was instructed to call if pain worsens.  I let patient know that all elective surgery cases have been postponed and we will contact him when we have the ok to schedule surgeries.

## 2018-05-23 ENCOUNTER — Inpatient Hospital Stay: Admission: RE | Admit: 2018-05-23 | Payer: Self-pay | Source: Ambulatory Visit

## 2018-06-01 ENCOUNTER — Ambulatory Visit: Admit: 2018-06-01 | Payer: Self-pay | Admitting: General Surgery

## 2018-06-01 SURGERY — REPAIR, HERNIA, VENTRAL
Anesthesia: General

## 2018-08-11 ENCOUNTER — Telehealth: Payer: Self-pay | Admitting: *Deleted

## 2018-08-11 NOTE — Telephone Encounter (Signed)
Called and spoke with patient's wife, Larence Penning, today.   Patient's surgery had previously been postponed due to COVID-19.   Wife states patient's Medicaid was switched from regular to [facility] which will not cover surgery and they are trying to get this corrected. They have tried to call his social worker but they will not be in until next Thursday.   Wife is requesting our office call her on Friday, 08-17-18 to discuss.   Note routed to Caryl-Lyn to follow up with patient next Friday in my absence.

## 2018-08-17 NOTE — Telephone Encounter (Signed)
Called patient to see if they had gotten in touch with the social worker to straighten out his insurance. Message left for him to call back.

## 2018-11-15 ENCOUNTER — Ambulatory Visit: Payer: Self-pay | Admitting: General Surgery

## 2018-11-20 ENCOUNTER — Encounter: Payer: Self-pay | Admitting: General Surgery

## 2018-11-20 ENCOUNTER — Ambulatory Visit: Payer: Medicaid Other | Admitting: General Surgery

## 2018-11-20 ENCOUNTER — Other Ambulatory Visit: Payer: Self-pay

## 2018-11-20 VITALS — BP 157/95 | HR 85 | Temp 97.7°F | Ht 66.0 in | Wt 307.4 lb

## 2018-11-20 DIAGNOSIS — K429 Umbilical hernia without obstruction or gangrene: Secondary | ICD-10-CM

## 2018-11-20 NOTE — Progress Notes (Signed)
Patient ID: David Medina, male   DOB: [birth year], 38 y.o.   MRN: [id number]  Chief Complaint  Patient presents with  . Pre-op Exam    Ventral Hernia    HPI David Medina is a 38 y.o. male.   He is a previous patient of Dr. Dwyane Luo.  He was scheduled for umbilical hernia repair in March, but then the COVID-19 restrictions came into play and his operation was postponed.  As Dr. Bary Castilla is no longer with this practice, David Medina was scheduled with me to discuss his operation.  David Medina states that the hernia has been present for 2 to 3 years.  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  Since being seen in March, he states that he has had no worsening of his symptoms.  He only notices discomfort with lifting or with increased physical activity.  He denies any nausea or vomiting.  He does endorse occasional constipation and he says that it sometimes helps if he pushes on the hernia.   Past Medical History:  Diagnosis Date  . DDD (degenerative disc disease), cervical   . Depression     History reviewed. No pertinent surgical history.  Family History  Problem Relation Age of Onset  . Heart disease Other     Social History Social History   Tobacco Use  . Smoking status: Former Smoker    Packs/day: 0.25    Years: 2.00    Pack years: 0.50    Types: Cigarettes    Quit date: 10/30/2018    Years since quitting: 0.0  . Smokeless tobacco: Former Network engineer Use Topics  . Alcohol use: Yes    Alcohol/week: 0.0 standard drinks    Comment: occ  . Drug use: No    Allergies  Allergen Reactions  . Cyclobenzaprine Rash  . Tramadol Swelling and Hives  . Amoxicillin Rash    Did it involve swelling of the face/tongue/throat, SOB, or low BP? No Did it involve sudden or severe rash/hives, skin peeling, or any reaction on the inside of your mouth or nose? No Did you need to seek medical attention at a hospital or  doctor's office? No When did it last happen?Over 10 years ago If all above answers are "NO", may proceed with cephalosporin use.    Current Outpatient Medications  Medication Sig Dispense Refill  . escitalopram (LEXAPRO) 20 MG tablet Take 1 tablet (20 mg total) by mouth daily. (Patient taking differently: Take 20 mg by mouth daily. Takes 10 mg daily instead) 90 tablet 1  . fluticasone (FLONASE) 50 MCG/ACT nasal spray PLACE 2 SPRAYS INTO BOTH NOSTRILS DAILY. 16 g 5  . meloxicam (MOBIC) 15 MG tablet Take 1 tablet (15 mg total) by mouth daily. 30 tablet 0   No current facility-administered medications for this visit.     Review of Systems Review of Systems  All other systems reviewed and are negative.   Blood pressure (!) 157/95, pulse 85, temperature 97.7 F (36.5 C), height 5\' 6"  (1.676 m), weight (!) 307 lb 6.4 oz (139.4 kg), SpO2 97 %.  Physical Exam Physical Exam Constitutional:      General: He is not in acute distress.    Appearance: Normal appearance. He is obese.  HENT:     Head: Normocephalic and atraumatic.     Comments: Piercing in the left eyebrow    Nose:     Comments: Covered with  a mask secondary to COVID-19 precautions    Mouth/Throat:     Comments: Covered with a mask secondary to COVID-19 precautions Eyes:     General: No scleral icterus.       Right eye: No discharge.        Left eye: No discharge.     Conjunctiva/sclera: Conjunctivae normal.  Neck:     Musculoskeletal: No neck rigidity.  Cardiovascular:     Rate and Rhythm: Normal rate and regular rhythm.     Pulses: Normal pulses.  Pulmonary:     Effort: Pulmonary effort is normal. No respiratory distress.     Breath sounds: Normal breath sounds.  Abdominal:     Palpations: Abdomen is soft.     Hernia: A hernia is present.       Comments: Protuberant, consistent with his level of obesity.  There is an umbilical hernia present.  It reduces while the patient is supine.  I am unable to appreciate  the fascial defect secondary to the patient's obesity.  Genitourinary:    Comments: Deferred Musculoskeletal:        General: No swelling.     Right lower leg: No edema.     Left lower leg: No edema.     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  Lymphadenopathy:     Cervical: No cervical adenopathy.  Skin:    Findings: Lesion present.     Comments: Multiple tattoos.  There are 2 small circular abrasions on his abdomen.  He thinks they are secondary to his belt buckle rubbing.  Neurological:     General: No focal deficit present.     Mental Status: He is alert and oriented to person, place, and time.  Psychiatric:        Mood and Affect: Mood normal.        Behavior: Behavior normal.     Data Reviewed I reviewed Dr. Dwyane Luo note of May 03, 2018.  This essentially delineates his plan for surgical repair of the umbilical hernia with mesh.  Assessment This is a 38 year old morbidly obese male with a symptomatic umbilical hernia.  He desires repair. I discussed the possibility of incarceration, strangulation, enlargement in size over time, and the risk of emergency surgery in the face of strangulation. I also discussed the risks of surgery including injury to adjacent structures, recurrence which can be up to 30% in the case of complex hernias, use of prosthetic materials (mesh) and the increased risk of infection, post-op infection and the possible need for re-operation and removal of mesh if used, possibility of post-op bowel obstruction or ileus, and the risks of general anesthetic including MI, CVA, sudden death or even reaction to anesthetic medications. The patient understands the risks, any and all questions were answered to the patient's satisfaction.  Plan We will schedule him for surgical repair.  Due to his severe obstructive sleep apnea as well as his morbid obesity, I will have him scheduled for a face-to-face visit with anesthesiology.     Fredirick Maudlin 11/20/2018, 4:42 PM

## 2018-11-22 ENCOUNTER — Telehealth: Payer: Self-pay | Admitting: General Surgery

## 2018-11-22 NOTE — Telephone Encounter (Signed)
I have called patient to go over the information below. No answer. I have left a message on voicemail for patient to call back.    pre admission date/time, Covid Testing date and Surgery date.  Surgery Date: 12/14/18 with Dr Almon Register umbilical hernia repair.  Preadmission Testing Date: 12/06/18 between 8-1:00pm-phone interview.  Covid Testing Date: 12/11/18 between 8-10:30am - patient advised to go to the [place] ([place])  [employer] Video sent via [employer] and [employer].  Patient has been made aware to call [phone], between 1-3:00pm the day before surgery, to find out what time to arrive.

## 2018-11-22 NOTE — Telephone Encounter (Signed)
Patient has been given all this information about his surgery.

## 2018-11-23 ENCOUNTER — Telehealth: Payer: Self-pay | Admitting: General Surgery

## 2018-11-23 NOTE — Telephone Encounter (Signed)
Patient is requesting a letter for his work. Due to patient having to self quarantine after the Covid test until Surgery date and then until his post op appointment.   Patient is scheduled to have his covid test done on 12/11/18. Surgery date is 12/14/18.

## 2018-11-23 NOTE — Telephone Encounter (Signed)
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 We discussed avoid groups and to minimize his exposure risk. He was asked to wear a mask to decrease his exposure of contracting covid prior to surgery.

## 2018-12-06 ENCOUNTER — Encounter
Admission: RE | Admit: 2018-12-06 | Discharge: 2018-12-06 | Disposition: A | Discharge status: Home / Self Care | Payer: Medicaid Other | Source: Ambulatory Visit | Attending: General Surgery | Admitting: General Surgery

## 2018-12-06 ENCOUNTER — Telehealth: Payer: Self-pay | Admitting: General Surgery

## 2018-12-06 ENCOUNTER — Other Ambulatory Visit: Payer: Self-pay

## 2018-12-06 HISTORY — DX: Sleep apnea, unspecified: G47.30

## 2018-12-06 HISTORY — DX: Anxiety disorder, unspecified: F41.9

## 2018-12-06 NOTE — Telephone Encounter (Signed)
Patient called said he was gonna be having a tooth removed on the 14th, and is having surgery with Dr. Celine Ahr on the 16th patient said the anesthesiologist told him due to him having a tooth removed due to an infection he would need to talk with the surgeon to make sure surgery would be okay for the 16th. Patient is currently on antibiotics patient can be reached at [phone]. Please call patient and advise.

## 2018-12-06 NOTE — Telephone Encounter (Signed)
I think, due to the strong possibility that I will need to use mesh, it would be better to defer the operation for at least a week.  I am unable to do it on the 23rd, however, as I have another obligation and must be done with my cases no later than. However, if the OR can give me a first start that day, then I could probably still accommodate him on the 23rd. Otherwise, the next best day would be the 28th.  Thanks!

## 2018-12-06 NOTE — Pre-Procedure Instructions (Signed)
Reviewed patients medical history with anesthesia David Medina). Will check  CBC and BMP no other testing indicated.

## 2018-12-06 NOTE — Patient Instructions (Addendum)
Your procedure is scheduled on: Friday 12/14/18 Report to [facility]. To find out your arrival time please call [phone] between 1PM - 3PM on Thursday 12/13/18.  Remember: Instructions that are not followed completely may result in serious medical risk, up to and including death, or upon the discretion of your surgeon and anesthesiologist your surgery may need to be rescheduled.     _X__ 1. Do not eat food after midnight the night before your procedure.                 No gum chewing or hard candies. You may drink clear liquids up to 2 hours                 before you are scheduled to arrive for your surgery- DO not drink clear                 liquids within 2 hours of the start of your surgery.                 Clear Liquids include:  water, apple juice without pulp, clear carbohydrate                 drink such as Clearfast or Gatorade, Black Coffee or Tea (Do not add                 anything to coffee or tea). Diabetics water only  __X__2.  On the morning of surgery brush your teeth with toothpaste and water, you                 may rinse your mouth with mouthwash if you wish.  Do not swallow any              toothpaste of mouthwash.     _X__ 3.  No Alcohol for 24 hours before or after surgery.   _X__ 4.  Do Not Smoke or use e-cigarettes For 24 Hours Prior to Your Surgery.                 Do not use any chewable tobacco products for at least 6 hours prior to                 surgery.  ____  5.  Bring all medications with you on the day of surgery if instructed.   __X__  6.  Notify your doctor if there is any change in your medical condition      (cold, fever, infections).     Do not wear jewelry, make-up, hairpins, clips or nail polish. Do not wear lotions, powders, or perfumes.  Do not shave 48 hours prior to surgery. Men may shave face and neck. Do not bring valuables to the hospital.    [facility] is not responsible for  any belongings or valuables.  Contacts, dentures/partials or body piercings may not be worn into surgery. Bring a case for your contacts, glasses or hearing aids, a denture cup will be supplied. Leave your suitcase in the car. After surgery it may be brought to your room. For patients admitted to the hospital, discharge time is determined by your treatment team.   Patients discharged the day of surgery will not be allowed to drive home.   Please read over the following fact sheets that you were given:   MRSA Information  __X__ Take these medicines the morning of surgery with A SIP OF WATER:  1. escitalopram (LEXAPRO)   2.   3.   4.  5.  6.  ____ Fleet Enema (as directed)   __X__ Use CHG Soap/SAGE wipes as directed  ____ Use inhalers on the day of surgery  ____ Stop metformin/Janumet/Farxiga 2 days prior to surgery    ____ Take 1/2 of usual insulin dose the night before surgery. No insulin the morning          of surgery.   ____ Stop Blood Thinners Coumadin/Plavix/Xarelto/Pleta/Pradaxa/Eliquis/Effient/Aspirin  on  Or contact your Surgeon, Cardiologist or Medical Doctor regarding  ability to stop your blood thinners  __X__ Stop Anti-inflammatories 7 days before surgery such as Advil, Ibuprofen, Motrin,  BC or Goodies Powder, Naprosyn, Naproxen, Aleve, Aspirin    __X__ Stop all herbal supplements, fish oil or vitamin E until after surgery.    ____ Bring C-Pap to the hospital.      Telephone interview. Instructions given. Patient verbalized understanding./cn 12/06/18 12:55

## 2018-12-10 ENCOUNTER — Telehealth: Payer: Self-pay | Admitting: *Deleted

## 2018-12-10 ENCOUNTER — Telehealth: Payer: Self-pay

## 2018-12-10 NOTE — Telephone Encounter (Signed)
Patient called back and is aware that surgery is canceled due to tooth infection, Angie please call patient back to r/s

## 2018-12-10 NOTE — Telephone Encounter (Signed)
Tried reaching patient, cell phone- unable to leave message due to mailbox not set up- Please let patient know due to tooth infection we will cancel surgery.  Angie will call patient to reschedule.

## 2018-12-11 ENCOUNTER — Other Ambulatory Visit: Admission: RE | Admit: 2018-12-11 | Payer: Medicaid Other | Source: Ambulatory Visit

## 2018-12-11 NOTE — Telephone Encounter (Signed)
Pt has been advised of pre admission date/time, Covid Testing date and Surgery date.  Surgery Date: 12/21/18 with Dr Everardo Beals Dahlia Byes to assist-open umbilical hernia repair with mesh.  Preadmission Testing Date: Completed.  Covid Testing Date: 12/18/18 between 8-10:30am - patient advised to go to the [place] ([place])  [employer] Video sent via [employer] and [employer].  Patient has been made aware to call [phone], between 1-3:00pm the day before surgery, to find out what time to arrive.

## 2018-12-11 NOTE — Telephone Encounter (Signed)
Pt has been advised of pre admission date/time, Covid Testing date and Surgery date.  Surgery Date: 12/21/18 with Dr Everardo Beals Dahlia Byes to assist-open umbilical hernia repair with mesh.  Preadmission Testing Date: Completed.  Covid Testing Date: 12/18/18 between 8-10:30am - patient advised to go to the [place] ([place])  [employer] Video sent via [employer] and [employer].  Patient has been made aware to call [phone], between 1-3:00pm the day before surgery, to find out what time to arrive.

## 2018-12-18 ENCOUNTER — Other Ambulatory Visit: Payer: Self-pay

## 2018-12-18 ENCOUNTER — Other Ambulatory Visit
Admission: RE | Admit: 2018-12-18 | Discharge: 2018-12-18 | Disposition: A | Discharge status: Home / Self Care | Payer: Medicaid Other | Source: Ambulatory Visit | Attending: General Surgery | Admitting: General Surgery

## 2018-12-18 DIAGNOSIS — Z01812 Encounter for preprocedural laboratory examination: Secondary | ICD-10-CM | POA: Diagnosis present

## 2018-12-18 DIAGNOSIS — Z20828 Contact with and (suspected) exposure to other viral communicable diseases: Secondary | ICD-10-CM | POA: Diagnosis not present

## 2018-12-18 LAB — BASIC METABOLIC PANEL
Anion gap: 9 (ref 5–15)
BUN: 19 mg/dL (ref 6–20)
CO2: 27 mmol/L (ref 22–32)
Calcium: 9.3 mg/dL (ref 8.9–10.3)
Chloride: 104 mmol/L (ref 98–111)
Creatinine, Ser: 0.78 mg/dL (ref 0.61–1.24)
GFR calc Af Amer: >60 mL/min (ref >60)
GFR calc non Af Amer: >60 mL/min (ref >60)
Glucose, Bld: 118 mg/dL — ABNORMAL HIGH (ref 70–99)
Potassium: 4 mmol/L (ref 3.5–5.1)
Sodium: 140 mmol/L (ref 135–145)

## 2018-12-18 LAB — CBC
HCT: 44.1 % (ref 39.0–52.0)
Hemoglobin: 14.9 g/dL (ref 13.0–17.0)
MCH: 29 pg (ref 26.0–34.0)
MCHC: 33.8 g/dL (ref 30.0–36.0)
MCV: 86 fL (ref 80.0–100.0)
Platelets: 212 10*3/uL (ref 150–400)
RBC: 5.13 MIL/uL (ref 4.22–5.81)
RDW: 13.6 % (ref 11.5–15.5)
WBC: 8.8 10*3/uL (ref 4.0–10.5)
nRBC: 0 % (ref 0.0–0.2)

## 2018-12-18 LAB — SARS CORONAVIRUS 2 (TAT 6-24 HRS): SARS Coronavirus 2: NEGATIVE

## 2018-12-20 MED ORDER — DEXTROSE 5 % IV SOLN
3.0000 g | INTRAVENOUS | Status: AC
  Administered 2018-12-21: 3 g via INTRAVENOUS
  Filled 2018-12-20: qty 3
  Filled 2018-12-20: qty 3000

## 2018-12-21 ENCOUNTER — Encounter
Admission: RE | Disposition: A | Discharge status: Home / Self Care | Payer: Self-pay | Source: Home / Self Care | Attending: General Surgery

## 2018-12-21 ENCOUNTER — Other Ambulatory Visit: Payer: Self-pay

## 2018-12-21 ENCOUNTER — Encounter: Payer: Self-pay | Admitting: Anesthesiology

## 2018-12-21 ENCOUNTER — Ambulatory Visit
Admission: RE | Admit: 2018-12-21 | Discharge: 2018-12-21 | Disposition: A | Discharge status: Home / Self Care | Payer: Medicaid Other | Attending: General Surgery | Admitting: General Surgery

## 2018-12-21 ENCOUNTER — Ambulatory Visit: Payer: Medicaid Other | Admitting: Anesthesiology

## 2018-12-21 DIAGNOSIS — M1990 Unspecified osteoarthritis, unspecified site: Secondary | ICD-10-CM | POA: Diagnosis not present

## 2018-12-21 DIAGNOSIS — K429 Umbilical hernia without obstruction or gangrene: Secondary | ICD-10-CM

## 2018-12-21 DIAGNOSIS — Z885 Allergy status to narcotic agent status: Secondary | ICD-10-CM | POA: Diagnosis not present

## 2018-12-21 DIAGNOSIS — F419 Anxiety disorder, unspecified: Secondary | ICD-10-CM | POA: Diagnosis not present

## 2018-12-21 DIAGNOSIS — K420 Umbilical hernia with obstruction, without gangrene: Secondary | ICD-10-CM | POA: Insufficient documentation

## 2018-12-21 DIAGNOSIS — Z79899 Other long term (current) drug therapy: Secondary | ICD-10-CM | POA: Insufficient documentation

## 2018-12-21 DIAGNOSIS — Z888 Allergy status to other drugs, medicaments and biological substances status: Secondary | ICD-10-CM | POA: Insufficient documentation

## 2018-12-21 DIAGNOSIS — Z87891 Personal history of nicotine dependence: Secondary | ICD-10-CM | POA: Diagnosis not present

## 2018-12-21 DIAGNOSIS — G4730 Sleep apnea, unspecified: Secondary | ICD-10-CM | POA: Insufficient documentation

## 2018-12-21 DIAGNOSIS — Z880 Allergy status to penicillin: Secondary | ICD-10-CM | POA: Insufficient documentation

## 2018-12-21 DIAGNOSIS — Z6841 Body Mass Index (BMI) 40.0 and over, adult: Secondary | ICD-10-CM | POA: Insufficient documentation

## 2018-12-21 HISTORY — PX: UMBILICAL HERNIA REPAIR: SHX196

## 2018-12-21 SURGERY — REPAIR, HERNIA, UMBILICAL, ADULT
Anesthesia: General

## 2018-12-21 MED ORDER — GABAPENTIN 300 MG PO CAPS
300.0000 mg | ORAL_CAPSULE | ORAL | Status: AC
  Administered 2018-12-21: 09:00:00 300 mg via ORAL

## 2018-12-21 MED ORDER — ROCURONIUM BROMIDE 50 MG/5ML IV SOLN
INTRAVENOUS | Status: AC
  Filled 2018-12-21: qty 1

## 2018-12-21 MED ORDER — SEVOFLURANE IN SOLN
RESPIRATORY_TRACT | Status: AC
  Filled 2018-12-21: qty 250

## 2018-12-21 MED ORDER — ONDANSETRON HCL 4 MG/2ML IJ SOLN
INTRAMUSCULAR | Status: DC | PRN
  Administered 2018-12-21: 4 mg via INTRAVENOUS

## 2018-12-21 MED ORDER — LACTATED RINGERS IV SOLN
INTRAVENOUS | Status: DC
  Administered 2018-12-21 (×2): via INTRAVENOUS

## 2018-12-21 MED ORDER — LIDOCAINE-EPINEPHRINE 1 %-1:100000 IJ SOLN
INTRAMUSCULAR | Status: AC
  Filled 2018-12-21: qty 1

## 2018-12-21 MED ORDER — CHLORHEXIDINE GLUCONATE CLOTH 2 % EX PADS: 6.0000 | MEDICATED_PAD | Freq: Once | CUTANEOUS | Status: DC

## 2018-12-21 MED ORDER — BUPIVACAINE HCL (PF) 0.25 % IJ SOLN
INTRAMUSCULAR | Status: DC | PRN
  Administered 2018-12-21: 10 mL

## 2018-12-21 MED ORDER — LIDOCAINE HCL (PF) 2 % IJ SOLN
INTRAMUSCULAR | Status: AC
  Filled 2018-12-21: qty 10

## 2018-12-21 MED ORDER — BUPIVACAINE LIPOSOME 1.3 % IJ SUSP
INTRAMUSCULAR | Status: DC | PRN
  Administered 2018-12-21: 20 mL

## 2018-12-21 MED ORDER — ACETAMINOPHEN 500 MG PO TABS
ORAL_TABLET | ORAL | Status: AC
  Administered 2018-12-21: 1000 mg via ORAL
  Filled 2018-12-21: qty 2

## 2018-12-21 MED ORDER — MIDAZOLAM HCL 2 MG/2ML IJ SOLN
INTRAMUSCULAR | Status: AC
  Filled 2018-12-21: qty 2

## 2018-12-21 MED ORDER — PROPOFOL 10 MG/ML IV BOLUS
INTRAVENOUS | Status: DC | PRN
  Administered 2018-12-21: 50 mg via INTRAVENOUS
  Administered 2018-12-21: 200 mg via INTRAVENOUS
  Administered 2018-12-21: 50 mg via INTRAVENOUS

## 2018-12-21 MED ORDER — FENTANYL CITRATE (PF) 100 MCG/2ML IJ SOLN
INTRAMUSCULAR | Status: AC
  Filled 2018-12-21: qty 2

## 2018-12-21 MED ORDER — HYDROCODONE-ACETAMINOPHEN 5-325 MG PO TABS: 1.0000 | ORAL_TABLET | Freq: Four times a day (QID) | ORAL | 0 refills | Status: DC | PRN

## 2018-12-21 MED ORDER — GABAPENTIN 300 MG PO CAPS
ORAL_CAPSULE | ORAL | Status: AC
  Administered 2018-12-21: 300 mg via ORAL
  Filled 2018-12-21: qty 1

## 2018-12-21 MED ORDER — FAMOTIDINE 20 MG PO TABS
20.0000 mg | ORAL_TABLET | Freq: Once | ORAL | Status: AC
  Administered 2018-12-21: 09:00:00 20 mg via ORAL

## 2018-12-21 MED ORDER — FENTANYL CITRATE (PF) 100 MCG/2ML IJ SOLN
INTRAMUSCULAR | Status: DC | PRN
  Administered 2018-12-21: 100 ug via INTRAVENOUS

## 2018-12-21 MED ORDER — OXYCODONE HCL 5 MG PO TABS
5.0000 mg | ORAL_TABLET | Freq: Once | ORAL | Status: AC | PRN
  Administered 2018-12-21: 5 mg via ORAL

## 2018-12-21 MED ORDER — LIDOCAINE-EPINEPHRINE 1 %-1:100000 IJ SOLN
INTRAMUSCULAR | Status: DC | PRN
  Administered 2018-12-21: 10 mL

## 2018-12-21 MED ORDER — EPHEDRINE SULFATE 50 MG/ML IJ SOLN
INTRAMUSCULAR | Status: AC
  Filled 2018-12-21: qty 1

## 2018-12-21 MED ORDER — FENTANYL CITRATE (PF) 100 MCG/2ML IJ SOLN
25.0000 ug | INTRAMUSCULAR | Status: DC | PRN
  Administered 2018-12-21: 12:00:00 25 ug via INTRAVENOUS
  Administered 2018-12-21: 50 ug via INTRAVENOUS
  Administered 2018-12-21 (×2): 25 ug via INTRAVENOUS

## 2018-12-21 MED ORDER — OXYCODONE HCL 5 MG/5ML PO SOLN: 5.0000 mg | Freq: Once | ORAL | Status: AC | PRN

## 2018-12-21 MED ORDER — OXYCODONE HCL 5 MG PO TABS
ORAL_TABLET | ORAL | Status: AC
  Filled 2018-12-21: qty 1

## 2018-12-21 MED ORDER — BUPIVACAINE HCL (PF) 0.25 % IJ SOLN
INTRAMUSCULAR | Status: AC
  Filled 2018-12-21: qty 30

## 2018-12-21 MED ORDER — SUGAMMADEX SODIUM 500 MG/5ML IV SOLN
INTRAVENOUS | Status: DC | PRN
  Administered 2018-12-21: 300 mg via INTRAVENOUS

## 2018-12-21 MED ORDER — PROPOFOL 10 MG/ML IV BOLUS
INTRAVENOUS | Status: AC
  Filled 2018-12-21: qty 20

## 2018-12-21 MED ORDER — BUPIVACAINE LIPOSOME 1.3 % IJ SUSP
INTRAMUSCULAR | Status: AC
  Filled 2018-12-21: qty 20

## 2018-12-21 MED ORDER — FENTANYL CITRATE (PF) 100 MCG/2ML IJ SOLN
INTRAMUSCULAR | Status: AC
  Administered 2018-12-21: 25 ug via INTRAVENOUS
  Filled 2018-12-21: qty 2

## 2018-12-21 MED ORDER — ACETAMINOPHEN 500 MG PO TABS
1000.0000 mg | ORAL_TABLET | ORAL | Status: AC
  Administered 2018-12-21: 09:00:00 1000 mg via ORAL

## 2018-12-21 MED ORDER — LIDOCAINE HCL (CARDIAC) PF 100 MG/5ML IV SOSY
PREFILLED_SYRINGE | INTRAVENOUS | Status: DC | PRN
  Administered 2018-12-21: 100 mg via INTRAVENOUS

## 2018-12-21 MED ORDER — CELECOXIB 200 MG PO CAPS
200.0000 mg | ORAL_CAPSULE | ORAL | Status: AC
  Administered 2018-12-21: 09:00:00 200 mg via ORAL

## 2018-12-21 MED ORDER — ROCURONIUM BROMIDE 100 MG/10ML IV SOLN
INTRAVENOUS | Status: DC | PRN
  Administered 2018-12-21 (×3): 10 mg via INTRAVENOUS
  Administered 2018-12-21: 20 mg via INTRAVENOUS
  Administered 2018-12-21: 50 mg via INTRAVENOUS

## 2018-12-21 MED ORDER — FAMOTIDINE 20 MG PO TABS
ORAL_TABLET | ORAL | Status: AC
  Administered 2018-12-21: 20 mg via ORAL
  Filled 2018-12-21: qty 1

## 2018-12-21 MED ORDER — BUPIVACAINE LIPOSOME 1.3 % IJ SUSP: 20.0000 mL | Freq: Once | INTRAMUSCULAR | Status: DC

## 2018-12-21 MED ORDER — IBUPROFEN 800 MG PO TABS: 800.0000 mg | ORAL_TABLET | Freq: Three times a day (TID) | ORAL | 0 refills | Status: DC | PRN

## 2018-12-21 MED ORDER — CELECOXIB 200 MG PO CAPS
ORAL_CAPSULE | ORAL | Status: AC
  Administered 2018-12-21: 200 mg via ORAL
  Filled 2018-12-21: qty 1

## 2018-12-21 MED ORDER — MIDAZOLAM HCL 2 MG/2ML IJ SOLN
INTRAMUSCULAR | Status: DC | PRN
  Administered 2018-12-21: 2 mg via INTRAVENOUS

## 2018-12-21 SURGICAL SUPPLY — 37 items
BLADE SURG 15 STRL LF DISP TIS (BLADE) ×1 IMPLANT
BLADE SURG 15 STRL SS (BLADE) ×1
CANISTER SUCT 1200ML W/VALVE (MISCELLANEOUS) ×2 IMPLANT
CHLORAPREP W/TINT 26 (MISCELLANEOUS) ×2 IMPLANT
COVER WAND RF STERILE (DRAPES) ×2 IMPLANT
DERMABOND ADVANCED (GAUZE/BANDAGES/DRESSINGS) ×1
DERMABOND ADVANCED .7 DNX12 (GAUZE/BANDAGES/DRESSINGS) ×1 IMPLANT
DRAPE LAPAROTOMY 77X122 PED (DRAPES) ×2 IMPLANT
DRSG TELFA 4X8 ISLAND PHMB (GAUZE/BANDAGES/DRESSINGS) ×1 IMPLANT
ELECT BLADE 6.5 EXT (BLADE) ×1 IMPLANT
ELECT CAUTERY BLADE TIP 2.5 (TIP) ×2
ELECT REM PT RETURN 9FT ADLT (ELECTROSURGICAL) ×2
ELECTRODE CAUTERY BLDE TIP 2.5 (TIP) ×1 IMPLANT
ELECTRODE REM PT RTRN 9FT ADLT (ELECTROSURGICAL) ×1 IMPLANT
GLOVE BIO SURGEON STRL SZ 6.5 (GLOVE) ×5 IMPLANT
GLOVE INDICATOR 7.0 STRL GRN (GLOVE) ×6 IMPLANT
GOWN STRL REUS W/ TWL LRG LVL3 (GOWN DISPOSABLE) ×2 IMPLANT
GOWN STRL REUS W/TWL LRG LVL3 (GOWN DISPOSABLE) ×4
KIT TURNOVER KIT A (KITS) ×2 IMPLANT
LABEL OR SOLS (LABEL) ×2 IMPLANT
MESH VENTRALEX ST 8CM LRG (Mesh General) ×2 IMPLANT
NEEDLE HYPO 22GX1.5 SAFETY (NEEDLE) ×2 IMPLANT
NS IRRIG 500ML POUR BTL (IV SOLUTION) ×2 IMPLANT
PACK BASIN MINOR ARMC (MISCELLANEOUS) ×2 IMPLANT
SLEEVE PROTECTION STRL DISP (MISCELLANEOUS) ×1 IMPLANT
STRIP CLOSURE SKIN 1/2X4 (GAUZE/BANDAGES/DRESSINGS) ×2 IMPLANT
SUT ETHIBOND NAB MO 7 #0 18IN (SUTURE) ×2 IMPLANT
SUT MNCRL 4-0 (SUTURE) ×1
SUT MNCRL 4-0 27XMFL (SUTURE) ×1
SUT SILK 2 0 (SUTURE) ×2
SUT SILK 2-0 18XBRD TIE 12 (SUTURE) IMPLANT
SUT VIC AB 2-0 CT1 (SUTURE) ×1 IMPLANT
SUT VIC AB 3-0 SH 27 (SUTURE) ×1
SUT VIC AB 3-0 SH 27X BRD (SUTURE) ×1 IMPLANT
SUTURE MNCRL 4-0 27XMF (SUTURE) ×1 IMPLANT
SYR 10ML LL (SYRINGE) ×2 IMPLANT
SYR BULB IRRIG 60ML STRL (SYRINGE) ×2 IMPLANT

## 2018-12-21 NOTE — Anesthesia Post-op Follow-up Note (Signed)
Anesthesia QCD[facility] form completed.        

## 2018-12-21 NOTE — Anesthesia Preprocedure Evaluation (Signed)
Anesthesia Evaluation  Patient identified by MRN, date of birth, ID band Patient awake    Reviewed: Allergy & Precautions, H&P , NPO status , Patient's Chart, lab work & pertinent test results  History of Anesthesia Complications Negative for: history of anesthetic complications  Airway Mallampati: II  TM Distance: <3 FB Neck ROM: full    Dental  (+) Chipped   Pulmonary neg shortness of breath, sleep apnea and Continuous Positive Airway Pressure Ventilation , former smoker,           Cardiovascular (-) angina(-) Past MI negative cardio ROS       Neuro/Psych PSYCHIATRIC DISORDERS negative neurological ROS     GI/Hepatic Neg liver ROS, GERD  Medicated and Controlled,  Endo/Other  negative endocrine ROS  Renal/GU      Musculoskeletal  (+) Arthritis ,   Abdominal   Peds  Hematology negative hematology ROS (+)   Anesthesia Other Findings Past Medical History: No date: Anxiety No date: DDD (degenerative disc disease), cervical No date: Depression No date: Sleep apnea  Past Surgical History: No date: DENTAL SURGERY     Reproductive/Obstetrics negative OB ROS                             Anesthesia Physical Anesthesia Plan  ASA: III  Anesthesia Plan: General ETT   Post-op Pain Management:    Induction: Intravenous  PONV Risk Score and Plan: Ondansetron, Dexamethasone, Midazolam and Treatment may vary due to age or medical condition  Airway Management Planned: Oral ETT and Video Laryngoscope Planned  Additional Equipment:   Intra-op Plan:   Post-operative Plan: Extubation in OR  Informed Consent: I have reviewed the patients History and Physical, chart, labs and discussed the procedure including the risks, benefits and alternatives for the proposed anesthesia with the patient or authorized representative who has indicated his/her understanding and acceptance.     Dental  Advisory Given  Plan Discussed with: Anesthesiologist, CRNA and Surgeon  Anesthesia Plan Comments: (Patient consented for risks of anesthesia including but not limited to:  - adverse reactions to medications - damage to teeth, lips or other oral mucosa - sore throat or hoarseness - Damage to heart, brain, lungs or loss of life  Patient voiced understanding.)        Anesthesia Quick Evaluation

## 2018-12-21 NOTE — Anesthesia Postprocedure Evaluation (Signed)
Anesthesia Post Note  Patient: David Medina  Procedure(s) Performed: HERNIA REPAIR UMBILICAL ADULT with Mesh (N/A )  Patient location during evaluation: PACU Anesthesia Type: General Level of consciousness: awake and alert Pain management: pain level controlled Vital Signs Assessment: post-procedure vital signs reviewed and stable Respiratory status: spontaneous breathing, nonlabored ventilation, respiratory function stable and patient connected to nasal cannula oxygen Cardiovascular status: blood pressure returned to baseline and stable Postop Assessment: no apparent nausea or vomiting Anesthetic complications: no     Last Vitals:  Vitals:   12/21/18 1212 12/21/18 1220  BP:  116/70  Pulse: 72 75  Resp: 16 16  Temp:    SpO2: 91% 94%    Last Pain:  Vitals:   12/21/18 1220  TempSrc: Temporal  PainSc: 10-Worst pain ever                 Precious Haws Piscitello

## 2018-12-21 NOTE — Transfer of Care (Signed)
Immediate Anesthesia Transfer of Care Note  Patient: David Medina  Procedure(s) Performed: HERNIA REPAIR UMBILICAL ADULT with Mesh (N/A )  Patient Location: PACU  Anesthesia Type:General  Level of Consciousness: awake and drowsy  Airway & Oxygen Therapy: Patient Spontanous Breathing and Patient connected to face mask oxygen  Post-op Assessment: Report given to RN and Post -op Vital signs reviewed and stable  Post vital signs: Reviewed and stable  Last Vitals:  Vitals Value Taken Time  BP 133/76 12/21/18 1134  Temp 36.5 C 12/21/18 1134  Pulse 88 12/21/18 1136  Resp 19 12/21/18 1136  SpO2 98 % 12/21/18 1136  Vitals shown include unvalidated device data.  Last Pain:  Vitals:   12/21/18 0847  TempSrc: Tympanic  PainSc: 0-No pain         Complications: No apparent anesthesia complications

## 2018-12-21 NOTE — Op Note (Signed)
Operative Note  Pre-operative Diagnosis: umbilical hernia, incarcerated  Post-operative Diagnosis: same  Operation: umbilical hernia repair with mesh  Surgeon: Fredirick Maudlin, MD  Anesthesia: GETA  Assistant: Caroleen Hamman, MD (a second surgeon was necessary due to the patient's extreme obesity and difficulty with exposure)  Findings: There was a substantial amount of omentum incarcerated with in the hernia sac.  It was partially resected in order to facilitate reduction of the hernia.  The skin of the umbilicus was nonviable at the end of the case, therefore it was resected.  Estimated Blood Loss: 50 cc         Drains: None         Specimens: Omentum and hernia sac          Complications: None immediately apparent         Condition: stable  Procedure Details  The patient was identified in the preoperative holding area. The benefits, complications, treatment options, and expected outcomes were discussed with the patient. The risks of bleeding, infection, recurrence of symptoms, failure to resolve symptoms, bowel injury, any of which could require further surgery were reviewed with the patient. The patient agreed to accept these risks. The patient was then taken to the operating room, identified as David Medina and the procedure verified.  A time out was performed and the above information confirmed.  Prior to the induction of general anesthesia, antibiotic prophylaxis was administered. VTE prophylaxis was in place. General endotracheal anesthesia was then administered and tolerated well. After induction, the abdomen was prepped with Chloraprep and draped in standard sterile fashion. The patient was positioned in the supine position.  The skin was infiltrated with a mixture of 1:1 0.25% bupivacaine and 1% lidocaine with epinephrine.  The umbilical incision was created over the hernia sac and electrocautery was used to dissect through subcutaneous tissue. The hernia was dissected free  from adjacent tissue and fascia. The hernia sac was entered and the sac was excised. Care was taken to avoid any injury to the bowel. The ventralex mesh was introduced and attached to the fascia using interrupted 0 Ethibond sutures in standard fashion.  The fascial defect was closed with interrupted 0 Ethibond sutures.  The skin overlying the hernia sac had been quite attenuated and thin and appeared nonviable at the end of the hernia repair.  We elected to resect it.  Healthy viable tissue remained.  The fascial layer was infiltrated with liposomal bupivacaine.  The deep space was closed with 2-0 Vicryl.  The deep dermal tissue was closed with 3-0 Vicryl and skin was closed with a subcuticular 4-0 Monocryl in a subcuticular fashion. Dermabond was applied to the skin.  A sterile dressing was applied.  An abdominal binder was placed around the patient.  He was then awakened, extubated, and taken to the postanesthesia care unit in good condition.  Patient tolerated procedure well and there were no immediate complications identified. Needle, instrument, and sponge counts were reported to be correct by the nursing staff.  Fredirick Maudlin, MD FACS

## 2018-12-21 NOTE — H&P (Addendum)
David Medina is a 38 y.o. male.   He is a previous patient of Dr. Dwyane Luo.  He was scheduled for umbilical hernia repair in March, but then the COVID-19 restrictions came into play and his operation was postponed.  As Dr. Bary Castilla is no longer with this practice, David Medina was scheduled with me to discuss his operation.  David Medina states that the hernia has been present for 2 to 3 years.  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  Since being seen in March, he states that he has had no worsening of his symptoms.  He only notices discomfort with lifting or with increased physical activity.  He denies any nausea or vomiting.  He does endorse occasional constipation and he says that it sometimes helps if he pushes on the hernia.   Past Medical History:  Diagnosis Date  . Anxiety   . DDD (degenerative disc disease), cervical   . Depression   . Sleep apnea     Past Surgical History:  Procedure Laterality Date  . DENTAL SURGERY     Family History  Problem Relation Age of Onset  . Heart disease Other    Social History   Tobacco Use  . Smoking status: Former Smoker    Packs/day: 0.25    Years: 2.00    Pack years: 0.50    Types: Cigarettes    Quit date: 10/30/2018    Years since quitting: 0.1  . Smokeless tobacco: Former Systems developer  . Tobacco comment: Quit smoking 2 weeks ago  Substance Use Topics  . Alcohol use: Yes    Alcohol/week: 0.0 standard drinks    Comment: occ  . Drug use: No   Current Meds  Medication Sig  . escitalopram (LEXAPRO) 10 MG tablet Take 10 mg by mouth daily.   Allergies  Allergen Reactions  . Cyclobenzaprine Rash  . Tramadol Swelling and Hives  . Amoxicillin Rash    Did it involve swelling of the face/tongue/throat, SOB, or low BP? No Did it involve sudden or severe rash/hives, skin peeling, or any reaction on the inside of your mouth or nose? No Did you need to seek medical attention at a  hospital or doctor's office? No When did it last happen?Over 10 years ago If all above answers are "NO", may proceed with cephalosporin use.     BP: 131/82 SpO2: 97 % RA HR: 84  Physical Exam  Constitutional: He is oriented to person, place, and time. He appears well-developed and well-nourished.  Obese   HENT:  Head: Normocephalic and atraumatic.  Eyes: Right eye exhibits no discharge. Left eye exhibits no discharge. No scleral icterus.  Neck: No tracheal deviation present.  Cardiovascular: Normal rate and regular rhythm.  Pulmonary/Chest: Effort normal. No stridor. No respiratory distress.  Abdominal: Soft.  Protuberant, consistent with his level of obesity.  Umbilical/periumbilical hernia present  Musculoskeletal:        General: No deformity or edema.  Neurological: He is alert and oriented to person, place, and time.  Skin: Skin is warm and dry.   Assessment/Plan: This is a 38 year old morbidly obese male with a symptomatic umbilical hernia.  He desires repair. I discussed the possibility of incarceration, strangulation, enlargement in size over time, and the risk of emergency surgery in the face of strangulation. I also discussed the risks of surgery including injury to adjacent structures, recurrence which can be up to 30% in the case  of complex hernias, use of prosthetic materials (mesh) and the increased risk of infection, post-op infection and the possible need for re-operation and removal of mesh if used, possibility of post-op bowel obstruction or ileus, and the risks of general anesthetic including MI, CVA, sudden death or even reaction to anesthetic medications. The patient understands the risks, any and all questions were answered to the patient's satisfaction.  We will proceed with repair today.

## 2018-12-21 NOTE — Discharge Instructions (Addendum)
Umbilical Hernia, Adult  A hernia is a bulge of tissue that pushes through an opening between muscles. An umbilical hernia happens in the abdomen, near the belly button (umbilicus). The hernia may contain tissues from the small intestine, large intestine, or fatty tissue covering the intestines (omentum). Umbilical hernias in adults tend to get worse over time, and they require surgical treatment. There are several types of umbilical hernias. You may have:  A hernia located just above or below the umbilicus (indirect hernia). This is the most common type of umbilical hernia in adults.  A hernia that forms through an opening formed by the umbilicus (direct hernia).  A hernia that comes and goes (reducible hernia). A reducible hernia may be visible only when you strain, lift something heavy, or cough. This type of hernia can be pushed back into the abdomen (reduced).  A hernia that traps abdominal tissue inside the hernia (incarcerated hernia). This type of hernia cannot be reduced.  A hernia that cuts off blood flow to the tissues inside the hernia (strangulated hernia). The tissues can start to die if this happens. This type of hernia requires emergency treatment. What are the causes? An umbilical hernia happens when tissue inside the abdomen presses on a weak area of the abdominal muscles. What increases the risk? You may have a greater risk of this condition if you:  Are obese.  Have had several pregnancies.  Have a buildup of fluid inside your abdomen (ascites).  Have had surgery that weakens the abdominal muscles. What are the signs or symptoms? The main symptom of this condition is a painless bulge at or near the belly button. A reducible hernia may be visible only when you strain, lift something heavy, or cough. Other symptoms may include:  Dull pain.  A feeling of pressure. Symptoms of a strangulated hernia may include:  Pain that gets increasingly worse.  Nausea and  vomiting.  Pain when pressing on the hernia.  Skin over the hernia becoming red or purple.  Constipation.  Blood in the stool. How is this diagnosed? This condition may be diagnosed based on:  A physical exam. You may be asked to cough or strain while standing. These actions increase the pressure inside your abdomen and force the hernia through the opening in your muscles. Your health care provider may try to reduce the hernia by pressing on it.  Your symptoms and medical history. How is this treated? Surgery is the only treatment for an umbilical hernia. Surgery for a strangulated hernia is done as soon as possible. If you have a small hernia that is not incarcerated, you may need to lose weight before having surgery. Follow these instructions at home:  Lose weight, if told by your health care provider.  Do not try to push the hernia back in.  Watch your hernia for any changes in color or size. Tell your health care provider if any changes occur.  You may need to avoid activities that increase pressure on your hernia.  Do not lift anything that is heavier than 10 lb (4.5 kg) until your health care provider says that this is safe.  Take over-the-counter and prescription medicines only as told by your health care provider.  Keep all follow-up visits as told by your health care provider. This is important. Contact a health care provider if:  Your hernia gets larger.  Your hernia becomes painful. Get help right away if:  You develop sudden, severe pain near the area of your hernia.  You have pain as well as nausea or vomiting.  You have pain and the skin over your hernia changes color.  You develop a fever. This information is not intended to replace advice given to you by your health care provider. Make sure you discuss any questions you have with your health care provider. Document Released: 07/17/2015 Document Revised: 03/29/2017 Document Reviewed: 08/15/2016 Elsevier  Patient Education  2020 [place] After Umbilical Hernia Repair  Refer to this sheet in the next few weeks. These instructions provide you with information on caring for yourself after your procedure. Your health care provider may also give you more specific instructions. Your treatment has been planned according to current medical practices, but problems sometimes occur. Call your health care provider if you have any problems or questions after your procedure.  HOME CARE INSTRUCTIONS   Only take over-the-counter or prescription medicines for pain, fever, or discomfort as directed by your health care provider. Do not take aspirin. It can cause bleeding.   Avoid lifting objects heavier than 10 lb (4.5 kg) for 6 weeks after surgery.   Avoid sexual activity and/or contact sports for 2 weeks after surgery or as directed by your health care provider.   Do not drive while taking prescription pain medicine.   You may return to your other normal, daily activities after 3 days or as directed by your health care provider. SEEK MEDICAL CARE IF:   You notice blood, fluid, or pus leaking from the surgical site.   Your incision area becomes red or swollen.   Your pain at the surgical site becomes worse or is not relieved by medicine.   You have problems urinating.   You feel nauseated or vomit more than 2 days after surgery.   You notice the bulge in your abdomen returns after the procedure. SEEK IMMEDIATE MEDICAL CARE IF:   You have a fever >101.5  You have nausea or vomiting that will not stop.  Open Hernia Repair, Adult, Care After This sheet gives you information about how to care for yourself after your procedure. Your health care provider may also give you more specific instructions. If you have problems or questions, contact your health care provider. What can I expect after the procedure? After the procedure, it is common to have:  Mild discomfort.  Slight  bruising.  Minor swelling.  Pain in the abdomen. Follow these instructions at home: Incision care   Follow instructions from your health care provider about how to take care of your incision area. Make sure you: ? Wash your hands with soap and water before you change your bandage (dressing). If soap and water are not available, use hand sanitizer. ? Change your dressing as told by your health care provider. ? Leave stitches (sutures), skin glue, or adhesive strips in place. These skin closures may need to stay in place for 2 weeks or longer. If adhesive strip edges start to loosen and curl up, you may trim the loose edges. Do not remove adhesive strips completely unless your health care provider tells you to do that.  Check your incision area every day for signs of infection. Check for: ? More redness, swelling, or pain. ? More fluid or blood. ? Warmth. ? Pus or a bad smell. Activity  Do not drive or use heavy machinery while taking prescription pain medicine. Do not drive until your health care provider approves.  Until your health care provider approves: ? Do not lift anything that  is heavier than 10 lb (4.5 kg). ? Do not play contact sports.  Return to your normal activities as told by your health care provider. Ask your health care provider what activities are safe. General instructions  To prevent or treat constipation while you are taking prescription pain medicine, your health care provider may recommend that you: ? Drink enough fluid to keep your urine clear or pale yellow. ? Take over-the-counter or prescription medicines. ? Eat foods that are high in fiber, such as fresh fruits and vegetables, whole grains, and beans. ? Limit foods that are high in fat and processed sugars, such as fried and sweet foods.  Take over-the-counter and prescription medicines only as told by your health care provider.  Do not take tub baths or go swimming until your health care provider  approves.  Keep all follow-up visits as told by your health care provider. This is important. Contact a health care provider if:  You develop a rash.  You have more redness, swelling, or pain around your incision.  You have more fluid or blood coming from your incision.  Your incision feels warm to the touch.  You have pus or a bad smell coming from your incision.  You have a fever or chills.  You have blood in your stool (feces).  You have not had a bowel movement in 2-3 days.  Your pain is not controlled with medicine. Get help right away if:  You have chest pain or shortness of breath.  You feel light-headed or feel faint.  You have severe pain.  You vomit and your pain is worse. This information is not intended to replace advice given to you by your health care provider. Make sure you discuss any questions you have with your health care provider. Document Released: 09/03/2004 Document Revised: 01/27/2017 Document Reviewed: 07/29/2015 Elsevier Patient Education  2020 [place]   1) The drugs that you were given will stay in your system until tomorrow so for the next 24 hours you should not:  A) Drive an automobile B) Make any legal decisions C) Drink any alcoholic beverage   2) You may resume regular meals tomorrow.  Today it is better to start with liquids and gradually work up to solid foods.  You may eat anything you prefer, but it is better to start with liquids, then soup and crackers, and gradually work up to solid foods.   3) Please notify your doctor immediately if you have any unusual bleeding, trouble breathing, redness and pain at the surgery site, drainage, fever, or pain not relieved by medication.    4) Additional Instructions:        Please contact your physician with any problems or Same Day Surgery at [phone], Monday through Friday 6 am to 4 pm, or [place] [facility]  number at [phone].

## 2018-12-21 NOTE — Anesthesia Procedure Notes (Addendum)
Procedure Name: Intubation Date/Time: 12/21/2018 9:20 AM Performed by: Babs Sciara, CRNA Pre-anesthesia Checklist: Patient identified, Emergency Drugs available, Suction available, Patient being monitored and Timeout performed Patient Re-evaluated:Patient Re-evaluated prior to induction Oxygen Delivery Method: Circle system utilized Preoxygenation: Pre-oxygenation with 100% oxygen Induction Type: IV induction Ventilation: Oral airway inserted - appropriate to patient size and Mask ventilation with difficulty Laryngoscope Size: McGraph and 4 Tube type: Oral Tube size: 7.5 mm Number of attempts: 1 Airway Equipment and Method: Video-laryngoscopy Placement Confirmation: positive ETCO2 and breath sounds checked- equal and bilateral Secured at: 21 (lip) cm Tube secured with: Tape Dental Injury: Teeth and Oropharynx as per pre-operative assessment

## 2018-12-24 ENCOUNTER — Encounter: Payer: Self-pay | Admitting: General Surgery

## 2018-12-24 LAB — SURGICAL PATHOLOGY

## 2018-12-26 ENCOUNTER — Other Ambulatory Visit: Payer: Self-pay

## 2018-12-26 ENCOUNTER — Telehealth: Payer: Self-pay

## 2018-12-26 ENCOUNTER — Emergency Department
Admission: EM | Admit: 2018-12-26 | Discharge: 2018-12-26 | Disposition: A | Discharge status: Home / Self Care | Payer: Medicaid Other | Attending: Student in an Organized Health Care Education/Training Program | Admitting: Student in an Organized Health Care Education/Training Program

## 2018-12-26 ENCOUNTER — Encounter: Payer: Self-pay | Admitting: Emergency Medicine

## 2018-12-26 ENCOUNTER — Encounter: Payer: Self-pay | Admitting: Surgery

## 2018-12-26 DIAGNOSIS — Z79899 Other long term (current) drug therapy: Secondary | ICD-10-CM | POA: Insufficient documentation

## 2018-12-26 DIAGNOSIS — G8918 Other acute postprocedural pain: Secondary | ICD-10-CM | POA: Diagnosis not present

## 2018-12-26 DIAGNOSIS — Z87891 Personal history of nicotine dependence: Secondary | ICD-10-CM | POA: Insufficient documentation

## 2018-12-26 LAB — CBC WITH DIFFERENTIAL/PLATELET
Abs Immature Granulocytes: 0.07 10*3/uL (ref 0.00–0.07)
Basophils Absolute: 0.1 10*3/uL (ref 0.0–0.1)
Basophils Relative: 1 %
Eosinophils Absolute: 0.2 10*3/uL (ref 0.0–0.5)
Eosinophils Relative: 2 %
HCT: 42.4 % (ref 39.0–52.0)
Hemoglobin: 14.4 g/dL (ref 13.0–17.0)
Immature Granulocytes: 1 %
Lymphocytes Relative: 17 %
Lymphs Abs: 2.1 10*3/uL (ref 0.7–4.0)
MCH: 29.2 pg (ref 26.0–34.0)
MCHC: 34 g/dL (ref 30.0–36.0)
MCV: 86 fL (ref 80.0–100.0)
Monocytes Absolute: 0.8 10*3/uL (ref 0.1–1.0)
Monocytes Relative: 6 %
Neutro Abs: 9.4 10*3/uL — ABNORMAL HIGH (ref 1.7–7.7)
Neutrophils Relative %: 73 %
Platelets: 226 10*3/uL (ref 150–400)
RBC: 4.93 MIL/uL (ref 4.22–5.81)
RDW: 13.7 % (ref 11.5–15.5)
WBC: 12.7 10*3/uL — ABNORMAL HIGH (ref 4.0–10.5)
nRBC: 0 % (ref 0.0–0.2)

## 2018-12-26 LAB — BASIC METABOLIC PANEL
Anion gap: 9 (ref 5–15)
BUN: 15 mg/dL (ref 6–20)
CO2: 25 mmol/L (ref 22–32)
Calcium: 9.1 mg/dL (ref 8.9–10.3)
Chloride: 102 mmol/L (ref 98–111)
Creatinine, Ser: 0.73 mg/dL (ref 0.61–1.24)
GFR calc Af Amer: >60 mL/min (ref >60)
GFR calc non Af Amer: >60 mL/min (ref >60)
Glucose, Bld: 105 mg/dL — ABNORMAL HIGH (ref 70–99)
Potassium: 3.9 mmol/L (ref 3.5–5.1)
Sodium: 136 mmol/L (ref 135–145)

## 2018-12-26 MED ORDER — POLYETHYLENE GLYCOL 3350 17 G PO PACK: 17.0000 g | PACK | Freq: Every day | ORAL | 0 refills | Status: DC

## 2018-12-26 MED ORDER — SODIUM CHLORIDE 0.9 % IV BOLUS
1000.0000 mL | Freq: Once | INTRAVENOUS | Status: AC
  Administered 2018-12-26: 1000 mL via INTRAVENOUS

## 2018-12-26 MED ORDER — HYDROCODONE-ACETAMINOPHEN 5-325 MG PO TABS: 1.0000 | ORAL_TABLET | Freq: Four times a day (QID) | ORAL | 0 refills | Status: DC | PRN

## 2018-12-26 NOTE — Progress Notes (Signed)
Pt seen and examined. S/p ventral hernia repair. Some drainage today. AVSS.  PE: NAD  Abd: soft, incision clean , no abscess, right lateral edge w 1 mm opening. No drainage. No necrotizing infection  A/P seroma , at this time no infection  F/U office Pain meds, ice an NSAIDS

## 2018-12-26 NOTE — ED Provider Notes (Signed)
[facility] Emergency Department Provider Note    First MD Initiated Contact with Patient 12/26/18 1629     (approximate)  I have reviewed the triage vital signs and the nursing notes.   HISTORY  Chief Complaint Post-op Problem    HPI David Medina is a 38 y.o. male [place] past medical history with recent periumbilical hernia repair on Friday presents the ER for right side incision pain and swelling some drainage started over the past 24 hours.  No measured fevers.  Did note some bloody drainage.  On able to report whether there was any purulence.  Denies any heavy lifting.  States that earlier today he was feeling just fine.  States the pain is mild to moderate in severity.  No nausea or vomiting.    Past Medical History:  Diagnosis Date  . Anxiety   . DDD (degenerative disc disease), cervical   . Depression   . Sleep apnea    Family History  Problem Relation Age of Onset  . Heart disease Other    Past Surgical History:  Procedure Laterality Date  . DENTAL SURGERY    . UMBILICAL HERNIA REPAIR N/A 12/21/2018   Procedure: HERNIA REPAIR UMBILICAL ADULT with Mesh;  Surgeon: Fredirick Maudlin, MD;  Location: [facility] ORS;  Service: General;  Laterality: N/A;   Patient Active Problem List   Diagnosis Date Noted  . Umbilical hernia without obstruction and without gangrene 11/28/2016  . Chronic pain syndrome 08/03/2016  . Anxiety associated with depression 02/16/2016  . Insomnia secondary to depression with anxiety 02/16/2016  . Gastroesophageal reflux disease 02/16/2016  . Prediabetes 04/13/2015  . Obesity, morbid, BMI 50 or higher ([place]) 12/22/2014  . Neuropathic pain 12/22/2014  . Restless leg syndrome 12/22/2014  . Hx of gout 12/22/2014  . Acute cystitis 10/16/2014  . 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 Benign paroxysmal positional nystagmus 10/16/2014  . Infection of urinary tract 10/16/2014  . Degenerative disc disease, lumbar  10/16/2014  . Blocked ear 10/16/2014  . Family history of cardiac disorder 10/16/2014  . Hypospadias, penile 10/16/2014      Prior to Admission medications   Medication Sig Start Date End Date Taking? Authorizing Provider  BC FAST PAIN RELIEF 845-65 MG PACK Take 1 packet by mouth daily as needed (PAIN.).    [provider]  escitalopram (LEXAPRO) 10 MG tablet Take 10 mg by mouth daily.    [provider]  HYDROcodone-acetaminophen (NORCO/VICODIN) 5-325 MG tablet Take 1 tablet by mouth every 6 (six) hours as needed for moderate pain. 12/26/18   Merlyn Lot, MD  ibuprofen (ADVIL) 800 MG tablet Take 1 tablet (800 mg total) by mouth every 8 (eight) hours as needed. 12/21/18   Fredirick Maudlin, MD  polyethylene glycol (MIRALAX / GLYCOLAX) 17 g packet Take 17 g by mouth daily. Mix one tablespoon with 8oz of your favorite juice or water every day until you are having soft formed stools. Then start taking once daily if you didn't have a stool the day before. 12/26/18   Merlyn Lot, MD    Allergies Cyclobenzaprine, Tramadol, and Amoxicillin    Social History Social History   Tobacco Use  . Smoking status: Former Smoker    Packs/day: 0.25    Years: 2.00    Pack years: 0.50    Types: Cigarettes    Quit date: 10/30/2018    Years since quitting: 0.1  . Smokeless tobacco: Former Network engineer Use Topics  .  Alcohol use: Yes    Alcohol/week: 0.0 standard drinks  . Drug use: No    Review of Systems Patient denies headaches, rhinorrhea, blurry vision, numbness, shortness of breath, chest pain, edema, cough, abdominal pain, nausea, vomiting, diarrhea, dysuria, fevers, rashes or hallucinations unless otherwise stated above in HPI. ____________________________________________   PHYSICAL EXAM:  VITAL SIGNS: Vitals:   12/26/18 1453  BP: 118/70  Pulse: 72  Resp: 16  Temp: 98.5 F (36.9 C)  SpO2: 96%    Constitutional: Alert and oriented.  Eyes:  Conjunctivae are normal.  Head: Atraumatic. Nose: No congestion/rhinnorhea. Mouth/Throat: Mucous membranes are moist.   Neck: No stridor. Painless ROM.  Cardiovascular: Normal rate, regular rhythm. Grossly normal heart sounds.  Good peripheral circulation. Respiratory: Normal respiratory effort.  No retractions. Lungs CTAB. Gastrointestinal: Soft .  Right aspect of the hernia repair incision does have an area of fluctuance without overlying erythema.  No purulent drainage.. No distention. No abdominal bruits. No CVA tenderness. Genitourinary:  Musculoskeletal: No lower extremity tenderness nor edema.  No joint effusions. Neurologic:  Normal speech and language. No gross focal neurologic deficits are appreciated. No facial droop Skin:  Skin is warm, dry and intact. No rash noted. Psychiatric: Mood and affect are normal. Speech and behavior are normal.  ____________________________________________   LABS (all labs ordered are listed, but only abnormal results are displayed)  Results for orders placed or performed during the hospital encounter of 12/26/18 (from the past 24 hour(s))  Basic metabolic panel     Status: Abnormal   Collection Time: 12/26/18  2:56 PM  Result Value Ref Range   Sodium 136 135 - 145 mmol/L   Potassium 3.9 3.5 - 5.1 mmol/L   Chloride 102 98 - 111 mmol/L   CO2 25 22 - 32 mmol/L   Glucose, Bld 105 (H) 70 - 99 mg/dL   BUN 15 6 - 20 mg/dL   Creatinine, Ser 0.73 0.61 - 1.24 mg/dL   Calcium 9.1 8.9 - 10.3 mg/dL   GFR calc non Af Amer >60 >60 mL/min   GFR calc Af Amer >60 >60 mL/min   Anion gap 9 5 - 15  CBC with Differential     Status: Abnormal   Collection Time: 12/26/18  2:56 PM  Result Value Ref Range   WBC 12.7 (H) 4.0 - 10.5 K/uL   RBC 4.93 4.22 - 5.81 MIL/uL   Hemoglobin 14.4 13.0 - 17.0 g/dL   HCT 42.4 39.0 - 52.0 %   MCV 86.0 80.0 - 100.0 fL   MCH 29.2 26.0 - 34.0 pg   MCHC 34.0 30.0 - 36.0 g/dL   RDW 13.7 11.5 - 15.5 %   Platelets 226 150 - 400  K/uL   nRBC 0.0 0.0 - 0.2 %   Neutrophils Relative % 73 %   Neutro Abs 9.4 (H) 1.7 - 7.7 K/uL   Lymphocytes Relative 17 %   Lymphs Abs 2.1 0.7 - 4.0 K/uL   Monocytes Relative 6 %   Monocytes Absolute 0.8 0.1 - 1.0 K/uL   Eosinophils Relative 2 %   Eosinophils Absolute 0.2 0.0 - 0.5 K/uL   Basophils Relative 1 %   Basophils Absolute 0.1 0.0 - 0.1 K/uL   Immature Granulocytes 1 %   Abs Immature Granulocytes 0.07 0.00 - 0.07 K/uL   ____________________________________________ ____________________________________________  RADIOLOGY   ____________________________________________   PROCEDURES  Procedure(s) performed:  Procedures    Critical Care performed: no ____________________________________________   INITIAL IMPRESSION / ASSESSMENT AND  PLAN / ED COURSE  Pertinent labs & imaging results that were available during my care of the patient were reviewed by me and considered in my medical decision making (see chart for details).   DDX: Abscess, seroma, cellulitis, dehiscence  David Medina is a 38 y.o. who presents to the ED with symptoms as described above.  Patient with pain at the incision site.  Have consulted Dr. Dahlia Byes of general surgery who kindly agrees to evaluate patient at bedside.  Clinical Course as of Dec 26 1807  Wed Dec 26, 2018  1750 Patient evaluated by surgery at bedside.  Feels that the wound is appropriate appearing.  No indication for further diagnostic imaging.  Will give pain control.  Patient has close outpatient follow-up and clinic.  Patient stable appropriate for discharge.   [PR]    Clinical Course User Index [PR] Merlyn Lot, MD    The patient was evaluated in Emergency Department today for the symptoms described in the history of present illness. He/she was evaluated in the context of the global COVID-19 pandemic, which necessitated consideration that the patient might be at risk for infection with the SARS-CoV-2 virus that causes  COVID-19. Institutional protocols and algorithms that pertain to the evaluation of patients at risk for COVID-19 are in a state of rapid change based on information released by regulatory bodies including the CDC and federal and state organizations. These policies and algorithms were followed during the patient's care in the ED.  As part of my medical decision making, I reviewed the following data within the [place] notes reviewed and incorporated, Labs reviewed, notes from prior ED visits and [place] Controlled Substance Database   ____________________________________________   FINAL CLINICAL IMPRESSION(S) / ED DIAGNOSES  Final diagnoses:  Post-operative pain      NEW MEDICATIONS STARTED DURING THIS VISIT:  New Prescriptions   POLYETHYLENE GLYCOL (MIRALAX / GLYCOLAX) 17 G PACKET    Take 17 g by mouth daily. Mix one tablespoon with 8oz of your favorite juice or water every day until you are having soft formed stools. Then start taking once daily if you didn't have a stool the day before.     Note:  This document was prepared using Dragon voice recognition software and may include unintentional dictation errors.    Merlyn Lot, MD 12/26/18 [phone]

## 2018-12-26 NOTE — ED Triage Notes (Signed)
abd hernia repair Friday.  Came by ems with abdominal pain with swelling to right side of surgical sight for 1.5 hours.  No fever.   Patient reported drainage from wound this aml

## 2018-12-26 NOTE — ED Triage Notes (Signed)
Pt in via [facility] from home, reports having hernia repair done 10/23, presents with pain to site, also reports "bulging" area at site of repair w/ some discharge.   Vitals WDL, NAD noted at this time.

## 2018-12-26 NOTE — Telephone Encounter (Signed)
12/21/18 Ventral hernia repair, patient states he has a large size lump over the incision, no bruising notes. Increased pain. Feels hot to the touch. Has applied hot/cold packs but patient states he was not having pain yesterday. He was out in his garage moving about and felt good. Patient is wearing the abdominal binder.   He was offered today however would like to see Dr.Cannon tomorrow. He was instructed to go to emergency room if he worsens.

## 2018-12-26 NOTE — ED Notes (Signed)
Unable to obtain d/c signature d/t computer frozen in pt's room. PT denies any further questions

## 2018-12-27 ENCOUNTER — Encounter: Payer: Self-pay | Admitting: General Surgery

## 2018-12-29 ENCOUNTER — Ambulatory Visit: Admit: 2018-12-29 | Payer: Medicaid Other | Admitting: General Surgery

## 2018-12-29 SURGERY — REPAIR, HERNIA, VENTRAL
Anesthesia: General

## 2019-01-10 ENCOUNTER — Other Ambulatory Visit: Payer: Self-pay

## 2019-01-10 ENCOUNTER — Ambulatory Visit (INDEPENDENT_AMBULATORY_CARE_PROVIDER_SITE_OTHER): Payer: Medicaid Other | Admitting: General Surgery

## 2019-01-10 ENCOUNTER — Encounter: Payer: Self-pay | Admitting: General Surgery

## 2019-01-10 VITALS — BP 141/81 | HR 83 | Temp 97.5°F | Ht 66.0 in | Wt 306.0 lb

## 2019-01-10 DIAGNOSIS — Z09 Encounter for follow-up examination after completed treatment for conditions other than malignant neoplasm: Secondary | ICD-10-CM | POA: Insufficient documentation

## 2019-01-10 NOTE — Progress Notes (Signed)
Sender David Medina is here today for a postoperative visit.  He underwent an open umbilical hernia repair on December 21, 2018.  It was a large hernia and a very challenging repair.  A piece of mesh was utilized to buttress the abdominal wall.  He did develop a hematoma that spontaneously drained.  He came to the emergency department for evaluation and was reassured that there was nothing significantly wrong.  He is here today for his scheduled appointment.  He reports that he has had some continued bloody drainage from the incision.  He denies any fevers or chills.  No nausea or vomiting.  His pain is been well controlled.  His appetite is good and he is having regular bowel movements.  Today's Vitals   01/10/19 1107  BP: (!) 141/81  Pulse: 83  Temp: (!) 97.5 F (36.4 C)  SpO2: 96%  Weight: (!) 306 lb (138.8 kg)  Height: 5\' 6"  (1.676 m)   Body mass index is 49.39 kg/m. Focused examination: There is a small opening in the surgical incision on the far right side.  There is some induration in the area, but no purulent drainage or erythema.  I was able to probe through the opening and appreciated some old hematoma.  The rest of the incision appears to be healing well.  Pression plan: This is a 38 year old morbidly obese man who had an umbilical hernia repair with mesh.  He did develop a postoperative hematoma that has been draining as it liquefies.  He was reassured that this does not represent a significant threat.  He may continue to use Band-Aids or gauze pads to prevent soiling his clothing.  There is still some hematoma present and I told him that this may continue to drain until it is completely drained or reabsorbed by his body.  He should continue to refrain from lifting anything heavier than 10 pounds for a total of 6 weeks from his surgery, which would be December 4.  He reports that his wife has some paperwork for his employer and she will bring it by our office.  We will see David Medina on an  as-needed basis.

## 2019-01-10 NOTE — Patient Instructions (Signed)
Please do not lift anything over 10 pounds until 02/01/2019. Please call if you have questions or concerns.

## 2019-02-06 ENCOUNTER — Telehealth: Payer: Self-pay

## 2019-02-06 NOTE — Telephone Encounter (Signed)
Our office received an fax on 01/29/19 requesting an updated doctor's note. Our office has tried to fax to both of the provided fax numbers for [employer]. Contacted the Leave of Absence department to see if we could send the requested letter via mail. Rep states that would be OK and provided me with the mailing address. Updated letter was mailed today 02/06/19 for patient.

## 2019-09-19 ENCOUNTER — Encounter: Payer: Self-pay | Admitting: Emergency Medicine

## 2019-09-19 ENCOUNTER — Emergency Department: Payer: Medicaid Other

## 2019-09-19 ENCOUNTER — Other Ambulatory Visit: Payer: Self-pay

## 2019-09-19 ENCOUNTER — Emergency Department
Admission: EM | Admit: 2019-09-19 | Discharge: 2019-09-19 | Disposition: A | Discharge status: Home / Self Care | Payer: Medicaid Other | Attending: Emergency Medicine | Admitting: Emergency Medicine

## 2019-09-19 DIAGNOSIS — G9009 Other idiopathic peripheral autonomic neuropathy: Secondary | ICD-10-CM | POA: Diagnosis not present

## 2019-09-19 DIAGNOSIS — Y999 Unspecified external cause status: Secondary | ICD-10-CM | POA: Diagnosis not present

## 2019-09-19 DIAGNOSIS — S61401A Unspecified open wound of right hand, initial encounter: Secondary | ICD-10-CM | POA: Insufficient documentation

## 2019-09-19 DIAGNOSIS — Y939 Activity, unspecified: Secondary | ICD-10-CM | POA: Insufficient documentation

## 2019-09-19 DIAGNOSIS — X58XXXA Exposure to other specified factors, initial encounter: Secondary | ICD-10-CM | POA: Diagnosis not present

## 2019-09-19 DIAGNOSIS — Y92009 Unspecified place in unspecified non-institutional (private) residence as the place of occurrence of the external cause: Secondary | ICD-10-CM | POA: Insufficient documentation

## 2019-09-19 DIAGNOSIS — Z87891 Personal history of nicotine dependence: Secondary | ICD-10-CM | POA: Insufficient documentation

## 2019-09-19 DIAGNOSIS — S61431A Puncture wound without foreign body of right hand, initial encounter: Secondary | ICD-10-CM

## 2019-09-19 MED ORDER — CEPHALEXIN 500 MG PO CAPS
500.0000 mg | ORAL_CAPSULE | Freq: Four times a day (QID) | ORAL | 0 refills | Status: AC
Start: 2019-09-19 — End: 2019-09-26

## 2019-09-19 MED ORDER — HYDROCODONE-ACETAMINOPHEN 5-325 MG PO TABS: 1.0000 | ORAL_TABLET | Freq: Four times a day (QID) | ORAL | 0 refills | Status: AC | PRN

## 2019-09-19 MED ORDER — OXYCODONE-ACETAMINOPHEN 5-325 MG PO TABS
1.0000 | ORAL_TABLET | Freq: Once | ORAL | Status: AC
  Administered 2019-09-19: 1 via ORAL
  Filled 2019-09-19: qty 1

## 2019-09-19 MED ORDER — HYDROCODONE-ACETAMINOPHEN 5-325 MG PO TABS: 1.0000 | ORAL_TABLET | Freq: Four times a day (QID) | ORAL | 0 refills | Status: DC | PRN

## 2019-09-19 MED ORDER — CEPHALEXIN 500 MG PO CAPS: 500.0000 mg | ORAL_CAPSULE | Freq: Four times a day (QID) | ORAL | 0 refills | Status: DC
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
## 2019-09-19 NOTE — Discharge Instructions (Signed)
Take the antibiotic as prescribed and finish the full course.  You may use the Norco as needed for pain.  Call to make an appointment with the orthopedic specialist for early next week.  Return to the ER for new, worsening, or persistent severe pain, swelling, bleeding, weakness, or any other new or worsening symptoms that concern you.  You should keep the splint on at all times except when showering.  You may change the dressing underneath the splint daily, and keep the hand clean and dry.  You can use bacitracin or Neosporin ointment on the wounds as needed.
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
## 2019-09-19 NOTE — ED Notes (Signed)
Right hand wrapped with sterile guaze, xeroform applied to wounds, and curlex. Per verbal orders from [place], MD.

## 2020-01-07 ENCOUNTER — Telehealth: Payer: Self-pay | Admitting: Orthopedic Surgery
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
## 2020-01-15 ENCOUNTER — Encounter: Payer: Self-pay | Admitting: Orthopedic Surgery

## 2020-01-27 ENCOUNTER — Telehealth: Payer: Self-pay | Admitting: Orthopedic Surgery

## 2020-01-27 NOTE — Telephone Encounter (Signed)
Good evening  Thank you for the message.  I am happy to see the patient for his shoulder, and his hand.  However, he needs to be aware that I am not a hand specialist, and am limited regarding potential treatment options.  Regarding previous records, I think it is best that he obtain all previous records regarding treatment and have these available in clinic.  If necessary, he should consider rescheduling the appointment if he wants to be seen for both issues.   Happy to answer any additional questions   Neilani Duffee A. Amedeo Kinsman, MD [facility] [address] [place],  [place]  [address] Phone: [phone] Fax: [phone]

## 2020-01-27 NOTE — Telephone Encounter (Signed)
Patient has been referred by his primary care provider, Hendricks Milo, FNP, for right shoulder pain. Please review patient's [facility] emergency room notes for right hand injury. Patient states he was also seen for the right hand by orthopaedist at [facility]. Please advise if those records are needed as well for review prior to scheduling for the right shoulder.

## 2020-01-28 NOTE — Telephone Encounter (Signed)
Spoke with patient and wife. Voiced understanding. Will request records from [facility] and will let us know when received so that appointment may be scheduled accordingly. Aware appointment is approved.

## 2020-02-25 ENCOUNTER — Telehealth: Payer: Self-pay | Admitting: Orthopedic Surgery

## 2020-02-25 NOTE — Telephone Encounter (Signed)
Yes please schedule. 
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
## 2020-02-25 NOTE — Telephone Encounter (Signed)
Called back to patient to schedule for shoulder.

## 2020-03-10 ENCOUNTER — Ambulatory Visit: Payer: Medicaid Other | Admitting: Orthopedic Surgery

## 2020-03-10 ENCOUNTER — Encounter: Payer: Self-pay | Admitting: Orthopedic Surgery

## 2020-05-03 ENCOUNTER — Other Ambulatory Visit: Payer: Self-pay | Admitting: Orthopedic Surgery

## 2020-05-06 ENCOUNTER — Other Ambulatory Visit
Admission: RE | Admit: 2020-05-06 | Discharge: 2020-05-06 | Disposition: A | Discharge status: Home / Self Care | Payer: Medicaid Other | Source: Ambulatory Visit | Attending: Orthopedic Surgery | Admitting: Orthopedic Surgery

## 2020-05-06 ENCOUNTER — Other Ambulatory Visit: Payer: Self-pay

## 2020-05-06 NOTE — Patient Instructions (Addendum)
Your procedure is scheduled on: Friday May 15, 2020. Report to Day Surgery inside [facility] 2nd floor Stop by Admissions desk first before getting on Elevator). To find out your arrival time please call [phone] between 1PM - 3PM on Thursday May 14, 2020.  Remember: Instructions that are not followed completely may result in serious medical risk,  up to and including death, or upon the discretion of your surgeon and anesthesiologist your  surgery may need to be rescheduled.     _X__ 1. Do not eat food after midnight the night before your procedure.                 No chewing gum or hard candies.  __X__2.  On the morning of surgery brush your teeth with toothpaste and water, you                may rinse your mouth with mouthwash if you wish.  Do not swallow any toothpaste of mouthwash.     _X__ 3.  No Alcohol for 24 hours before or after surgery.   _X__ 4.  Do Not Smoke or use e-cigarettes For 24 Hours Prior to Your Surgery.                 Do not use any chewable tobacco products for at least 6 hours prior to                 Surgery.  _X__  5.  Do not use any recreational drugs (marijuana, cocaine, heroin, ecstasy, MDMA or other)                For at least one week prior to your surgery.  Combination of these drugs with anesthesia                May have life threatening results.  __X__ 6.  Notify your doctor if there is any change in your medical condition      (cold, fever, infections).     Do not wear jewelry, make-up, hairpins, clips or nail polish. Do not wear lotions, powders, or perfumes.  Do not shave 48 hours prior to surgery. Men may shave face and neck. Do not bring valuables to the hospital.    [facility] is not responsible for any belongings or valuables.  Contacts, dentures or bridgework may not be worn into surgery. Leave your suitcase in the car. After surgery it may be brought to your room. For patients admitted to the  hospital, discharge time is determined by your treatment team.   Patients discharged the day of surgery will not be allowed to drive home.   Make arrangements for someone to be with you for the first 24 hours of your Same Day Discharge.  __X__ Take these medicines the morning of surgery with A SIP OF WATER:    1. None   2.   3.   4.  5.  6.  ____ Fleet Enema (as directed)   __X__ Use CHG Soap (or wipes) as directed  ____ Use Benzoyl Peroxide Gel as instructed  ____ Use inhalers on the day of surgery  ____ Stop metformin 2 days prior to surgery    __X__ Stop Anti-inflammatories such as Ibuprofen, Aleve, Advil, naproxen, aspirin and or BC powders.   __X__ Stop supplements until after surgery.    __X__ Do not start any herbal supplements before your procedure.  __X__ Bring C-Pap to the hospital.  If you have any  questions regarding your pre-procedure instructions,  Please call Pre-admit Testing at [phone].

## 2020-05-08 ENCOUNTER — Encounter
Admission: RE | Admit: 2020-05-08 | Discharge: 2020-05-08 | Disposition: A | Discharge status: Home / Self Care | Payer: Medicaid Other | Source: Ambulatory Visit | Attending: Orthopedic Surgery | Admitting: Orthopedic Surgery

## 2020-05-08 ENCOUNTER — Other Ambulatory Visit: Payer: Self-pay

## 2020-05-08 DIAGNOSIS — Z01812 Encounter for preprocedural laboratory examination: Secondary | ICD-10-CM | POA: Insufficient documentation

## 2020-05-08 LAB — CBC WITH DIFFERENTIAL/PLATELET
Abs Immature Granulocytes: 0.03 10*3/uL (ref 0.00–0.07)
Basophils Absolute: 0.1 10*3/uL (ref 0.0–0.1)
Basophils Relative: 1 %
Eosinophils Absolute: 0.3 10*3/uL (ref 0.0–0.5)
Eosinophils Relative: 3 %
HCT: 45.3 % (ref 39.0–52.0)
Hemoglobin: 15.3 g/dL (ref 13.0–17.0)
Immature Granulocytes: 0 %
Lymphocytes Relative: 27 %
Lymphs Abs: 2.6 10*3/uL (ref 0.7–4.0)
MCH: 29.1 pg (ref 26.0–34.0)
MCHC: 33.8 g/dL (ref 30.0–36.0)
MCV: 86.1 fL (ref 80.0–100.0)
Monocytes Absolute: 0.6 10*3/uL (ref 0.1–1.0)
Monocytes Relative: 6 %
Neutro Abs: 6 10*3/uL (ref 1.7–7.7)
Neutrophils Relative %: 63 %
Platelets: 230 10*3/uL (ref 150–400)
RBC: 5.26 MIL/uL (ref 4.22–5.81)
RDW: 13.8 % (ref 11.5–15.5)
WBC: 9.7 10*3/uL (ref 4.0–10.5)
nRBC: 0 % (ref 0.0–0.2)

## 2020-05-08 LAB — BASIC METABOLIC PANEL
Anion gap: 10 (ref 5–15)
BUN: 15 mg/dL (ref 6–20)
CO2: 25 mmol/L (ref 22–32)
Calcium: 9.1 mg/dL (ref 8.9–10.3)
Chloride: 105 mmol/L (ref 98–111)
Creatinine, Ser: 0.89 mg/dL (ref 0.61–1.24)
GFR, Estimated: >60 mL/min (ref >60)
Glucose, Bld: 143 mg/dL — ABNORMAL HIGH (ref 70–99)
Potassium: 3.3 mmol/L — ABNORMAL LOW (ref 3.5–5.1)
Sodium: 140 mmol/L (ref 135–145)

## 2020-05-08 LAB — APTT: aPTT: 32 seconds (ref 24–36)

## 2020-05-08 LAB — PROTIME-INR
INR: 1 (ref 0.8–1.2)
Prothrombin Time: 12.9 seconds (ref 11.4–15.2)

## 2020-05-13 ENCOUNTER — Other Ambulatory Visit: Payer: Self-pay

## 2020-05-13 ENCOUNTER — Other Ambulatory Visit
Admission: RE | Admit: 2020-05-13 | Discharge: 2020-05-13 | Disposition: A | Discharge status: Home / Self Care | Payer: Medicaid Other | Source: Ambulatory Visit | Attending: Orthopedic Surgery | Admitting: Orthopedic Surgery

## 2020-05-13 DIAGNOSIS — Z20822 Contact with and (suspected) exposure to covid-19: Secondary | ICD-10-CM | POA: Diagnosis not present

## 2020-05-13 DIAGNOSIS — Z01812 Encounter for preprocedural laboratory examination: Secondary | ICD-10-CM | POA: Insufficient documentation

## 2020-05-13 LAB — SARS CORONAVIRUS 2 (TAT 6-24 HRS): SARS Coronavirus 2: NEGATIVE

## 2020-05-15 ENCOUNTER — Other Ambulatory Visit: Payer: Self-pay

## 2020-05-15 ENCOUNTER — Ambulatory Visit: Payer: Medicaid Other | Admitting: Certified Registered Nurse Anesthetist

## 2020-05-15 ENCOUNTER — Encounter
Admission: RE | Disposition: A | Discharge status: Home / Self Care | Payer: Self-pay | Source: Ambulatory Visit | Attending: Orthopedic Surgery

## 2020-05-15 ENCOUNTER — Encounter: Payer: Self-pay | Admitting: Orthopedic Surgery

## 2020-05-15 ENCOUNTER — Ambulatory Visit: Payer: Medicaid Other

## 2020-05-15 ENCOUNTER — Ambulatory Visit
Admission: RE | Admit: 2020-05-15 | Discharge: 2020-05-15 | Disposition: A | Discharge status: Home / Self Care | Payer: Medicaid Other | Source: Ambulatory Visit | Attending: Orthopedic Surgery | Admitting: Orthopedic Surgery

## 2020-05-15 DIAGNOSIS — S43431A Superior glenoid labrum lesion of right shoulder, initial encounter: Secondary | ICD-10-CM | POA: Insufficient documentation

## 2020-05-15 DIAGNOSIS — Z791 Long term (current) use of non-steroidal anti-inflammatories (NSAID): Secondary | ICD-10-CM | POA: Insufficient documentation

## 2020-05-15 DIAGNOSIS — Z880 Allergy status to penicillin: Secondary | ICD-10-CM | POA: Diagnosis not present

## 2020-05-15 DIAGNOSIS — Z8249 Family history of ischemic heart disease and other diseases of the circulatory system: Secondary | ICD-10-CM | POA: Insufficient documentation

## 2020-05-15 DIAGNOSIS — Z87891 Personal history of nicotine dependence: Secondary | ICD-10-CM | POA: Diagnosis not present

## 2020-05-15 DIAGNOSIS — M75121 Complete rotator cuff tear or rupture of right shoulder, not specified as traumatic: Secondary | ICD-10-CM | POA: Insufficient documentation

## 2020-05-15 DIAGNOSIS — S43081A Other subluxation of right shoulder joint, initial encounter: Secondary | ICD-10-CM | POA: Diagnosis not present

## 2020-05-15 DIAGNOSIS — X58XXXA Exposure to other specified factors, initial encounter: Secondary | ICD-10-CM | POA: Insufficient documentation

## 2020-05-15 DIAGNOSIS — Z419 Encounter for procedure for purposes other than remedying health state, unspecified: Secondary | ICD-10-CM

## 2020-05-15 DIAGNOSIS — Z888 Allergy status to other drugs, medicaments and biological substances status: Secondary | ICD-10-CM | POA: Diagnosis not present

## 2020-05-15 DIAGNOSIS — Z885 Allergy status to narcotic agent status: Secondary | ICD-10-CM | POA: Insufficient documentation

## 2020-05-15 HISTORY — PX: SHOULDER ARTHROSCOPY WITH LABRAL REPAIR: SHX5691

## 2020-05-15 HISTORY — PX: SHOULDER ARTHROSCOPY WITH OPEN ROTATOR CUFF REPAIR AND DISTAL CLAVICLE ACROMINECTOMY: SHX5683

## 2020-05-15 LAB — POCT I-STAT, CHEM 8
BUN: 17 mg/dL (ref 6–20)
Calcium, Ion: 1.18 mmol/L (ref 1.15–1.40)
Chloride: 105 mmol/L (ref 98–111)
Creatinine, Ser: 0.8 mg/dL (ref 0.61–1.24)
Glucose, Bld: 119 mg/dL — ABNORMAL HIGH (ref 70–99)
HCT: 46 % (ref 39.0–52.0)
Hemoglobin: 15.6 g/dL (ref 13.0–17.0)
Potassium: 5.3 mmol/L — ABNORMAL HIGH (ref 3.5–5.1)
Sodium: 140 mmol/L (ref 135–145)
TCO2: 28 mmol/L (ref 22–32)

## 2020-05-15 SURGERY — ARTHROSCOPY, SHOULDER, WITH GLENOID LABRUM REPAIR
Anesthesia: General | Site: Shoulder | Laterality: Right

## 2020-05-15 MED ORDER — DEXAMETHASONE SODIUM PHOSPHATE 10 MG/ML IJ SOLN
INTRAMUSCULAR | Status: DC | PRN
  Administered 2020-05-15: 10 mg via INTRAVENOUS

## 2020-05-15 MED ORDER — ROCURONIUM BROMIDE 10 MG/ML (PF) SYRINGE
PREFILLED_SYRINGE | INTRAVENOUS | Status: AC
  Filled 2020-05-15: qty 10

## 2020-05-15 MED ORDER — CELECOXIB 200 MG PO CAPS
ORAL_CAPSULE | ORAL | Status: AC
  Administered 2020-05-15: 200 mg via ORAL
  Filled 2020-05-15: qty 1

## 2020-05-15 MED ORDER — BUPIVACAINE LIPOSOME 1.3 % IJ SUSP
INTRAMUSCULAR | Status: DC | PRN
  Administered 2020-05-15: 13 mL via PERINEURAL
  Administered 2020-05-15: 7 mL via PERINEURAL

## 2020-05-15 MED ORDER — PROPOFOL 10 MG/ML IV BOLUS
INTRAVENOUS | Status: DC | PRN
  Administered 2020-05-15: 50 mg via INTRAVENOUS
  Administered 2020-05-15: 200 mg via INTRAVENOUS

## 2020-05-15 MED ORDER — FENTANYL CITRATE (PF) 100 MCG/2ML IJ SOLN
INTRAMUSCULAR | Status: AC
  Administered 2020-05-15: 25 ug
  Filled 2020-05-15: qty 2

## 2020-05-15 MED ORDER — CHLORHEXIDINE GLUCONATE 0.12 % MT SOLN
OROMUCOSAL | Status: AC
  Administered 2020-05-15: 15 mL via OROMUCOSAL
  Filled 2020-05-15: qty 15

## 2020-05-15 MED ORDER — ACETAMINOPHEN 325 MG PO TABS: 325.0000 mg | ORAL_TABLET | ORAL | 1 refills | Status: AC | PRN

## 2020-05-15 MED ORDER — PROMETHAZINE HCL 25 MG/ML IJ SOLN: 6.2500 mg | INTRAMUSCULAR | Status: DC | PRN

## 2020-05-15 MED ORDER — PROPOFOL 10 MG/ML IV BOLUS
INTRAVENOUS | Status: AC
  Filled 2020-05-15: qty 20

## 2020-05-15 MED ORDER — DEXMEDETOMIDINE (PRECEDEX) IN NS 20 MCG/5ML (4 MCG/ML) IV SYRINGE
PREFILLED_SYRINGE | INTRAVENOUS | Status: DC | PRN
  Administered 2020-05-15: 12 ug via INTRAVENOUS

## 2020-05-15 MED ORDER — MIDAZOLAM HCL 2 MG/2ML IJ SOLN: 2.0000 mg | Freq: Once | INTRAMUSCULAR | Status: AC

## 2020-05-15 MED ORDER — DEXTROSE 5 % IV SOLN
3.0000 g | INTRAVENOUS | Status: AC
  Administered 2020-05-15: 3 g via INTRAVENOUS
  Filled 2020-05-15: qty 3

## 2020-05-15 MED ORDER — OXYCODONE HCL 5 MG/5ML PO SOLN: 5.0000 mg | Freq: Once | ORAL | Status: AC | PRN

## 2020-05-15 MED ORDER — OXYCODONE HCL 5 MG PO TABS
ORAL_TABLET | ORAL | Status: AC
  Administered 2020-05-15: 5 mg via ORAL
  Filled 2020-05-15: qty 1

## 2020-05-15 MED ORDER — LIDOCAINE HCL (PF) 2 % IJ SOLN
INTRAMUSCULAR | Status: AC
  Filled 2020-05-15: qty 5

## 2020-05-15 MED ORDER — ONDANSETRON HCL 4 MG/2ML IJ SOLN
INTRAMUSCULAR | Status: AC
  Filled 2020-05-15: qty 2

## 2020-05-15 MED ORDER — LIDOCAINE HCL (CARDIAC) PF 100 MG/5ML IV SOSY
PREFILLED_SYRINGE | INTRAVENOUS | Status: DC | PRN
  Administered 2020-05-15: 100 mg via INTRAVENOUS

## 2020-05-15 MED ORDER — LIDOCAINE HCL (PF) 1 % IJ SOLN
INTRAMUSCULAR | Status: AC
  Filled 2020-05-15: qty 5

## 2020-05-15 MED ORDER — FAMOTIDINE 20 MG PO TABS: 20.0000 mg | ORAL_TABLET | Freq: Once | ORAL | Status: AC

## 2020-05-15 MED ORDER — CHLORHEXIDINE GLUCONATE 0.12 % MT SOLN: 15.0000 mL | Freq: Once | OROMUCOSAL | Status: AC

## 2020-05-15 MED ORDER — CHLORHEXIDINE GLUCONATE CLOTH 2 % EX PADS: 6.0000 | MEDICATED_PAD | Freq: Once | CUTANEOUS | Status: DC

## 2020-05-15 MED ORDER — OXYCODONE HCL 5 MG PO TABS: 5.0000 mg | ORAL_TABLET | Freq: Once | ORAL | Status: AC | PRN

## 2020-05-15 MED ORDER — SUGAMMADEX SODIUM 200 MG/2ML IV SOLN
INTRAVENOUS | Status: DC | PRN
  Administered 2020-05-15: 300 mg via INTRAVENOUS

## 2020-05-15 MED ORDER — ONDANSETRON HCL 4 MG PO TABS: 4.0000 mg | ORAL_TABLET | Freq: Three times a day (TID) | ORAL | 0 refills | Status: AC | PRN

## 2020-05-15 MED ORDER — SUGAMMADEX SODIUM 500 MG/5ML IV SOLN
INTRAVENOUS | Status: AC
  Filled 2020-05-15: qty 5

## 2020-05-15 MED ORDER — DEXAMETHASONE SODIUM PHOSPHATE 10 MG/ML IJ SOLN
INTRAMUSCULAR | Status: AC
  Filled 2020-05-15: qty 1

## 2020-05-15 MED ORDER — PHENYLEPHRINE HCL (PRESSORS) 10 MG/ML IV SOLN
INTRAVENOUS | Status: DC | PRN
  Administered 2020-05-15: 100 ug via INTRAVENOUS

## 2020-05-15 MED ORDER — NEOMYCIN-POLYMYXIN B GU 40-200000 IR SOLN
Status: DC | PRN
  Administered 2020-05-15: 4 mL

## 2020-05-15 MED ORDER — CELECOXIB 200 MG PO CAPS: 200.0000 mg | ORAL_CAPSULE | ORAL | Status: AC

## 2020-05-15 MED ORDER — ACETAMINOPHEN 500 MG PO TABS
ORAL_TABLET | ORAL | Status: AC
  Administered 2020-05-15: 1000 mg via ORAL
  Filled 2020-05-15: qty 2

## 2020-05-15 MED ORDER — FENTANYL CITRATE (PF) 100 MCG/2ML IJ SOLN
INTRAMUSCULAR | Status: AC
  Filled 2020-05-15: qty 2

## 2020-05-15 MED ORDER — ONDANSETRON HCL 4 MG/2ML IJ SOLN
INTRAMUSCULAR | Status: DC | PRN
  Administered 2020-05-15: 4 mg via INTRAVENOUS

## 2020-05-15 MED ORDER — MIDAZOLAM HCL 2 MG/2ML IJ SOLN
INTRAMUSCULAR | Status: AC
  Administered 2020-05-15: 2 mg via INTRAVENOUS
  Filled 2020-05-15: qty 2

## 2020-05-15 MED ORDER — BUPIVACAINE HCL (PF) 0.5 % IJ SOLN
INTRAMUSCULAR | Status: DC | PRN
  Administered 2020-05-15: 7 mL via PERINEURAL
  Administered 2020-05-15: 3 mL via PERINEURAL

## 2020-05-15 MED ORDER — BUPIVACAINE LIPOSOME 1.3 % IJ SUSP
INTRAMUSCULAR | Status: AC
  Filled 2020-05-15: qty 20

## 2020-05-15 MED ORDER — ACETAMINOPHEN 500 MG PO TABS: 1000.0000 mg | ORAL_TABLET | ORAL | Status: AC

## 2020-05-15 MED ORDER — ROCURONIUM BROMIDE 100 MG/10ML IV SOLN
INTRAVENOUS | Status: DC | PRN
  Administered 2020-05-15: 50 mg via INTRAVENOUS
  Administered 2020-05-15: 30 mg via INTRAVENOUS

## 2020-05-15 MED ORDER — OXYCODONE HCL 5 MG PO TABS: 5.0000 mg | ORAL_TABLET | ORAL | 0 refills | Status: DC | PRN

## 2020-05-15 MED ORDER — FAMOTIDINE 20 MG PO TABS
ORAL_TABLET | ORAL | Status: AC
  Administered 2020-05-15: 20 mg via ORAL
  Filled 2020-05-15: qty 1

## 2020-05-15 MED ORDER — LACTATED RINGERS IV SOLN: INTRAVENOUS | Status: DC

## 2020-05-15 MED ORDER — NEOMYCIN-POLYMYXIN B GU 40-200000 IR SOLN
Status: AC
  Filled 2020-05-15: qty 4

## 2020-05-15 MED ORDER — EPINEPHRINE PF 1 MG/ML IJ SOLN
INTRAMUSCULAR | Status: AC
  Filled 2020-05-15: qty 4

## 2020-05-15 MED ORDER — ORAL CARE MOUTH RINSE: 15.0000 mL | Freq: Once | OROMUCOSAL | Status: AC

## 2020-05-15 MED ORDER — LACTATED RINGERS IV SOLN
INTRAVENOUS | Status: DC | PRN
  Administered 2020-05-15 (×4): 3001 mL

## 2020-05-15 MED ORDER — BUPIVACAINE HCL (PF) 0.5 % IJ SOLN
INTRAMUSCULAR | Status: AC
  Filled 2020-05-15: qty 10

## 2020-05-15 MED ORDER — FENTANYL CITRATE (PF) 100 MCG/2ML IJ SOLN
INTRAMUSCULAR | Status: DC | PRN
  Administered 2020-05-15: 50 ug via INTRAVENOUS

## 2020-05-15 MED ORDER — MIDAZOLAM HCL 2 MG/2ML IJ SOLN
INTRAMUSCULAR | Status: AC
  Filled 2020-05-15: qty 2

## 2020-05-15 MED ORDER — FENTANYL CITRATE (PF) 100 MCG/2ML IJ SOLN
25.0000 ug | INTRAMUSCULAR | Status: DC | PRN
Start: 2020-05-15 — End: 2020-05-15
  Administered 2020-05-15: 25 ug via INTRAVENOUS

## 2020-05-15 SURGICAL SUPPLY — 73 items
ADAPTER IRRIG TUBE 2 SPIKE SOL (ADAPTER) ×4 IMPLANT
ADPR TBG 2 SPK PMP STRL ASCP (ADAPTER) ×2
ANCH SUT 2 SUTTK 14.5X3 (Anchor) ×5 IMPLANT
ANCH SUT 5.5 KNTLS PEEK (Orthopedic Implant) ×2 IMPLANT
ANCHOR SUT 5.5 MULTIFIX (Orthopedic Implant) ×4 IMPLANT
ANCHOR SUT BIOC ST 3X145 (Anchor) ×10 IMPLANT
BUR RADIUS 4.0X18.5 (BURR) ×2 IMPLANT
BUR RADIUS 5.5 (BURR) ×2 IMPLANT
CANISTER SUCT LVC 12 LTR MEDI- (MISCELLANEOUS) ×2 IMPLANT
CANNULA 5.75X7 CRYSTAL CLEAR (CANNULA) ×4 IMPLANT
CANNULA PARTIAL THREAD 2X7 (CANNULA) ×4 IMPLANT
CANNULA TWIST IN 8.25X9CM (CANNULA) ×4 IMPLANT
CONNECTOR PERFECT PASSER (CONNECTOR) ×6 IMPLANT
COOLER POLAR GLACIER W/PUMP (MISCELLANEOUS) ×2 IMPLANT
COVER WAND RF STERILE (DRAPES) ×2 IMPLANT
DEVICE SUCT BLK HOLE OR FLOOR (MISCELLANEOUS) ×4 IMPLANT
DRAPE 3/4 80X56 (DRAPES) ×2 IMPLANT
DRAPE IMP U-DRAPE 54X76 (DRAPES) ×4 IMPLANT
DRAPE INCISE IOBAN 66X45 STRL (DRAPES) ×2 IMPLANT
DRAPE U-SHAPE 47X51 STRL (DRAPES) ×2 IMPLANT
DURAPREP 26ML APPLICATOR (WOUND CARE) ×6 IMPLANT
ELECT REM PT RETURN 9FT ADLT (ELECTROSURGICAL) ×2
ELECTRODE REM PT RTRN 9FT ADLT (ELECTROSURGICAL) ×1 IMPLANT
GAUZE SPONGE 4X4 12PLY STRL (GAUZE/BANDAGES/DRESSINGS) ×2 IMPLANT
GAUZE XEROFORM 1X8 LF (GAUZE/BANDAGES/DRESSINGS) ×2 IMPLANT
GLOVE SURG 9.0 ORTHO LTXF (GLOVE) ×6 IMPLANT
GLOVE SURG UNDER POLY LF SZ9 (GLOVE) ×2 IMPLANT
GOWN STRL REUS TWL 2XL XL LVL4 (GOWN DISPOSABLE) ×2 IMPLANT
GOWN STRL REUS W/ TWL LRG LVL3 (GOWN DISPOSABLE) ×1 IMPLANT
GOWN STRL REUS W/ TWL LRG LVL4 (GOWN DISPOSABLE) ×1 IMPLANT
GOWN STRL REUS W/TWL LRG LVL3 (GOWN DISPOSABLE) ×2
GOWN STRL REUS W/TWL LRG LVL4 (GOWN DISPOSABLE) ×2
IV LACTATED RINGER IRRG 3000ML (IV SOLUTION) ×32
IV LR IRRIG 3000ML ARTHROMATIC (IV SOLUTION) ×16 IMPLANT
KIT STABILIZATION SHOULDER (MISCELLANEOUS) ×2 IMPLANT
KIT SUTURE 2.8 Q-FIX DISP (MISCELLANEOUS) ×2 IMPLANT
KIT SUTURETAK 3.0 INSERT PERC (KITS) ×2 IMPLANT
KIT TURNOVER KIT A (KITS) ×2 IMPLANT
MANIFOLD NEPTUNE II (INSTRUMENTS) ×4 IMPLANT
MASK FACE SPIDER DISP (MASK) ×2 IMPLANT
MAT ABSORB  FLUID 56X50 GRAY (MISCELLANEOUS) ×3
MAT ABSORB FLUID 56X50 GRAY (MISCELLANEOUS) ×3 IMPLANT
NDL SAFETY ECLIPSE 18X1.5 (NEEDLE) ×1 IMPLANT
NEEDLE HYPO 18GX1.5 SHARP (NEEDLE) ×2
NEEDLE HYPO 22GX1.5 SAFETY (NEEDLE) ×2 IMPLANT
NS IRRIG 500ML POUR BTL (IV SOLUTION) ×2 IMPLANT
PACK ARTHROSCOPY SHOULDER (MISCELLANEOUS) ×2 IMPLANT
PAD ARMBOARD 7.5X6 YLW CONV (MISCELLANEOUS) ×4 IMPLANT
PAD WRAPON POLAR SHDR XLG (MISCELLANEOUS) ×1 IMPLANT
PASSER SUT FIRSTPASS SELF (INSTRUMENTS) ×2 IMPLANT
SET TUBE SUCT SHAVER OUTFL 24K (TUBING) ×2 IMPLANT
SET TUBE TIP INTRA-ARTICULAR (MISCELLANEOUS) ×2 IMPLANT
STRAP SAFETY 5IN WIDE (MISCELLANEOUS) ×2 IMPLANT
STRIP CLOSURE SKIN 1/2X4 (GAUZE/BANDAGES/DRESSINGS) ×2 IMPLANT
SUT ETHILON 4-0 (SUTURE) ×2
SUT ETHILON 4-0 FS2 18XMFL BLK (SUTURE) ×1
SUT LASSO 90 DEG SD STR (SUTURE) ×2 IMPLANT
SUT MNCRL 4-0 (SUTURE) ×2
SUT MNCRL 4-0 27XMFL (SUTURE) ×1
SUT PDS AB 0 CT1 27 (SUTURE) ×6 IMPLANT
SUT PERFECTPASSER WHITE CART (SUTURE) ×16 IMPLANT
SUT SMART STITCH CARTRIDGE (SUTURE) ×10 IMPLANT
SUT ULTRABRAID 2 COBRAID 38 (SUTURE) IMPLANT
SUT VIC AB 0 CT1 36 (SUTURE) ×6 IMPLANT
SUT VIC AB 2-0 CT2 27 (SUTURE) ×2 IMPLANT
SUTURE ETHLN 4-0 FS2 18XMF BLK (SUTURE) ×1 IMPLANT
SUTURE MNCRL 4-0 27XMF (SUTURE) ×1 IMPLANT
SYR 10ML LL (SYRINGE) ×2 IMPLANT
TAPE MICROFOAM 4IN (TAPE) ×2 IMPLANT
TUBING ARTHRO INFLOW-ONLY STRL (TUBING) ×2 IMPLANT
TUBING CONNECTING 10 (TUBING) ×2 IMPLANT
WAND HAND CNTRL MULTIVAC 90 (MISCELLANEOUS) ×2 IMPLANT
WRAPON POLAR PAD SHDR XLG (MISCELLANEOUS) ×2

## 2020-05-15 NOTE — Transfer of Care (Signed)
Immediate Anesthesia Transfer of Care Note  Patient: David Medina  Procedure(s) Performed: RIGHT SHOULDER ARTHROSCOPY WITH LABRAL REPAIR (Right Shoulder) RIGHT SHOULDER ARTHROSCOPY WITH OPEN ROTATOR CUFF REPAIR (Right Shoulder)  Patient Location: PACU  Anesthesia Type:General  Level of Consciousness: sedated  Airway & Oxygen Therapy: Patient Spontanous Breathing and Patient connected to face mask oxygen  Post-op Assessment: Report given to RN and Post -op Vital signs reviewed and stable  Post vital signs: Reviewed and stable  Last Vitals:  Vitals Value Taken Time  BP 133/102 05/15/20 1515  Temp    Pulse 89 05/15/20 1521  Resp 43 05/15/20 1521  SpO2 99 % 05/15/20 1521  Vitals shown include unvalidated device data.  Last Pain:  Vitals:   05/15/20 0827  TempSrc: Temporal  PainSc: 0-No pain         Complications: No complications documented.
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
## 2020-05-15 NOTE — Anesthesia Procedure Notes (Signed)
Procedure Name: Intubation Date/Time: 05/15/2020 10:32 AM Performed by: Johnna Acosta, CRNA Pre-anesthesia Checklist: Patient identified, Emergency Drugs available, Suction available, Patient being monitored and Timeout performed Patient Re-evaluated:Patient Re-evaluated prior to induction Oxygen Delivery Method: Circle system utilized Preoxygenation: Pre-oxygenation with 100% oxygen Induction Type: IV induction Ventilation: Mask ventilation without difficulty and Oral airway inserted - appropriate to patient size Laryngoscope Size: McGraph and 4 Grade View: Grade I Tube type: Oral Tube size: 7.5 mm Number of attempts: 1 Airway Equipment and Method: Stylet,  Video-laryngoscopy and Oral airway Placement Confirmation: ETT inserted through vocal cords under direct vision,  positive ETCO2 and breath sounds checked- equal and bilateral Secured at: 22 cm Tube secured with: Tape Dental Injury: Teeth and Oropharynx as per pre-operative assessment  Difficulty Due To: Difficulty was anticipated, Difficult Airway- due to large tongue, Difficult Airway- due to reduced neck mobility and Difficult Airway- due to limited oral opening

## 2020-05-15 NOTE — H&P (Signed)
PREOPERATIVE H&P  Chief Complaint: Right Complete Rotator Cuff Tear and Labral Tear  HPI: David Medina is a 40 y.o. male who presents for preoperative history and physical with a diagnosis of right full-thickness rotator cuff tears involving the supraspinatus and subscapularis.  Patient has medial subluxation of the biceps and a posterior labral tear confirmed by MRI.  Symptoms of pain, weakness and limited range of motion in the right shoulder are significantly impairing activities of daily living.  Patient has failed nonoperative management and wished to proceed with surgical fixation for the above-noted injuries.    Past Medical History:  Diagnosis Date  . Anxiety   . DDD (degenerative disc disease), cervical   . Depression   . Sleep apnea    Past Surgical History:  Procedure Laterality Date  . DENTAL SURGERY    . UMBILICAL HERNIA REPAIR N/A 12/21/2018   Procedure: HERNIA REPAIR UMBILICAL ADULT with Mesh;  Surgeon: Fredirick Maudlin, MD;  Location: [facility] ORS;  Service: General;  Laterality: N/A;   Social History   Socioeconomic History  . Marital status: Married    Spouse name: Not on file  . Number of children: 8  . Years of education: Not on file  . Highest education level: Not on file  Occupational History  . Not on file  Tobacco Use  . Smoking status: Former Smoker    Packs/day: 0.25    Years: 2.00    Pack years: 0.50    Types: Cigarettes    Quit date: 10/30/2018    Years since quitting: 1.5  . Smokeless tobacco: Former Network engineer  . Vaping Use: Some days  Substance and Sexual Activity  . Alcohol use: Yes    Alcohol/week: 0.0 standard drinks  . Drug use: No  . Sexual activity: Yes  Other Topics Concern  . Not on file  Social History Narrative  . Not on file   Social Determinants of Health   Financial Resource Strain: Not on file  Food Insecurity: Not on file  Transportation Needs: Not on file  Physical Activity: Not on file  Stress: Not on file   Social Connections: Not on file   Family History  Problem Relation Age of Onset  . Heart disease Other    Allergies  Allergen Reactions  . Cyclobenzaprine Rash  . Tramadol Swelling and Hives  . Fentanyl And Related     Nausea and vomiting    . Amoxicillin Rash    Did it involve swelling of the face/tongue/throat, SOB, or low BP? No Did it involve sudden or severe rash/hives, skin peeling, or any reaction on the inside of your mouth or nose? No Did you need to seek medical attention at a hospital or doctor's office? No When did it last happen?Over 10 years ago If all above answers are "NO", may proceed with cephalosporin use.   Prior to Admission medications   Medication Sig Start Date End Date Taking? Authorizing Provider  DULoxetine (CYMBALTA) 30 MG capsule Take 90 mg by mouth at bedtime. 08/29/19  Yes [provider]  loratadine-pseudoephedrine (CLARITIN-D 12-HOUR) 5-120 MG tablet Take 1 tablet by mouth daily as needed for allergies.   Yes [provider]  ibuprofen (ADVIL) 200 MG tablet Take 200 mg by mouth every 6 (six) hours as needed for headache or moderate pain. Patient not taking: No sig reported    [provider]     Positive ROS: All other systems have been reviewed and were otherwise negative  with the exception of those mentioned in the HPI and as above.  Physical Exam: General: Alert, no acute distress Cardiovascular: Regular rate and rhythm, no murmurs rubs or gallops.  No pedal edema Respiratory: Clear to auscultation bilaterally, no wheezes rales or rhonchi. No cyanosis, no use of accessory musculature GI: No organomegaly, abdomen is soft and non-tender nondistended with positive bowel sounds. Skin: Skin intact, no lesions within the operative field. Neurologic: Sensation intact distally Psychiatric: Patient is competent for consent with normal mood and affect Lymphatic: No cervical lymphadenopathy  MUSCULOSKELETAL: Right shoulder:  Patient can abduct to approximately 60 degrees and forward elevate approximately 70 degrees.  Patient has weakness to shoulder abduction, external rotation and internal rotation.  He has pain with impingement testing but no apprehension or stability.  Assessment: Right full-thickness rotator cuff tear involving the supraspinatus and infraspinatus, posterior labral tear and medially subluxed biceps tendon  Plan: Plan for Procedure(s): RIGHT SHOULDER ARTHROSCOPY WITH LABRAL REPAIR RIGHT SHOULDER ARTHROSCOPY WITH MINI-OPEN ROTATOR CUFF REPAIR  I reviewed the details of the operation as well as the postoperative course with the patient.  A preop history and physical was performed at the bedside.  I marked the right shoulder according the hospital's correct site of surgery protocol.  I discussed the risks and benefits of surgery. The risks include but are not limited to infection, bleeding, nerve or blood vessel injury, joint stiffness or loss of motion, persistent pain, weakness or instability, retear of the rotator cuff or labrum and hardware failure and the need for further surgery. Medical risks include but are not limited to DVT and pulmonary embolism, myocardial infarction, stroke, pneumonia, respiratory failure and death. Patient understood these risks and wished to proceed.    Thornton Park, MD   05/15/2020 10:21 AM

## 2020-05-15 NOTE — Discharge Instructions (Addendum)
Interscalene Nerve Block with Exparel    WEAR GREEN BRACELET FOR 3 DAYS  1.  For your surgery you have received an Interscalene Nerve Block with Exparel. 2. Nerve Blocks affect many types of nerves, including nerves that control movement, pain and normal sensation.  You may experience feelings such as numbness, tingling, heaviness, weakness or the inability to move your arm or the feeling or sensation that your arm has "fallen asleep". 3. A nerve block with Exparel can last up to 5 days.  Usually the weakness wears off first.  The tingling and heaviness usually wear off next.  Finally you may start to notice pain.  Keep in mind that this may occur in any order.  Once a nerve block starts to wear off it is usually completely gone within 60 minutes. 4. ISNB may cause mild shortness of breath, a hoarse voice, blurry vision, unequal pupils, or drooping of the face on the same side as the nerve block.  These symptoms will usually resolve with the numbness.  Very rarely the procedure itself can cause mild seizures. 5. If needed, your surgeon will give you a prescription for pain medication.  It will take about 60 minutes for the oral pain medication to become fully effective.  So, it is recommended that you start taking this medication before the nerve block first begins to wear off, or when you first begin to feel discomfort. 6. Take your pain medication only as prescribed.  Pain medication can cause sedation and decrease your breathing if you take more than you need for the level of pain that you have. 7. Nausea is a common side effect of many pain medications.  You may want to eat something before taking your pain medicine to prevent nausea. 8. After an Interscalene nerve block, you cannot feel pain, pressure or extremes in temperature in the effected arm.  Because your arm is numb it is at an increased risk for injury.  To decrease the possibility of injury, please practice the following:  a. While you  are awake change the position of your arm frequently to prevent too much pressure on any one area for prolonged periods of time. b.  If you have a cast or tight dressing, check the color or your fingers every couple of hours.  Call your surgeon with the appearance of any discoloration (white or blue). c. If you are given a sling to wear before you go home, please wear it  at all times until the block has completely worn off.  Do not get up at night without your sling. d. Please contact [facility] or your surgeon if you do not begin to regain sensation after 7 days from the surgery.  Anesthesia may be contacted by calling the Same Day Surgery Department, Mon. through Fri., 6 am to 4 pm at [phone].   e. If you experience any other problems or concerns, please contact your surgeon's office. f. If you experience severe or prolonged shortness of breath go to the nearest emergency department.     AMBULATORY SURGERY  DISCHARGE INSTRUCTIONS   1) The drugs that you were given will stay in your system until tomorrow so for the next 24 hours you should not:  A) Drive an automobile B) Make any legal decisions C) Drink any alcoholic beverage   2) You may resume regular meals tomorrow.  Today it is better to start with liquids and gradually work up to solid foods.  You may eat anything you  prefer, but it is better to start with liquids, then soup and crackers, and gradually work up to solid foods.   3) Please notify your doctor immediately if you have any unusual bleeding, trouble breathing, redness and pain at the surgery site, drainage, fever, or pain not relieved by medication.    4) Additional Instructions:        Please contact your physician with any problems or Same Day Surgery at [phone], Monday through Friday 6 am to 4 pm, or [place] at [facility] number at [phone].  Bupivacaine Liposomal Suspension for Injection What is this medicine? BUPIVACAINE  LIPOSOMAL (bue PIV a kane LIP oh som al) is an anesthetic. It causes loss of feeling in the skin or other tissues. It is used to prevent and to treat pain from some procedures. This medicine may be used for other purposes; ask your health care provider or pharmacist if you have questions. COMMON BRAND NAME(S): EXPAREL What should I tell my health care provider before I take this medicine? They need to know if you have any of these conditions:  G6PD deficiency  heart disease  kidney disease  liver disease  low blood pressure  lung or breathing disease, like asthma  an unusual or allergic reaction to bupivacaine, other medicines, foods, dyes, or preservatives  pregnant or trying to get pregnant  breast-feeding How should I use this medicine? This medicine is injected into the affected area. It is given by a health care provider in a hospital or clinic setting. Talk to your health care provider about the use of this medicine in children. While it may be given to children as young as 6 years for selected conditions, precautions do apply. Overdosage: If you think you have taken too much of this medicine contact a poison control center or emergency room at once. NOTE: This medicine is only for you. Do not share this medicine with others. What if I miss a dose? This does not apply. What may interact with this medicine? This medicine may interact with the following medications:  acetaminophen  certain antibiotics like dapsone, nitrofurantoin, aminosalicylic acid, sulfonamides  certain medicines for seizures like phenobarbital, phenytoin, valproic acid  chloroquine  cyclophosphamide  flutamide  hydroxyurea  ifosfamide  metoclopramide  nitric oxide  nitroglycerin  nitroprusside  nitrous oxide  other local anesthetics like lidocaine, pramoxine, tetracaine  primaquine  quinine  rasburicase  sulfasalazine This list may not describe all possible interactions.  Give your health care provider a list of all the medicines, herbs, non-prescription drugs, or dietary supplements you use. Also tell them if you smoke, drink alcohol, or use illegal drugs. Some items may interact with your medicine. What should I watch for while using this medicine? Your condition will be monitored carefully while you are receiving this medicine. Be careful to avoid injury while the area is numb, and you are not aware of pain. What side effects may I notice from receiving this medicine? Side effects that you should report to your doctor or health care professional as soon as possible:  allergic reactions like skin rash, itching or hives, swelling of the face, lips, or tongue  seizures  signs and symptoms of a dangerous change in heartbeat or heart rhythm like chest pain; dizziness; fast, irregular heartbeat; palpitations; feeling faint or lightheaded; falls; breathing problems  signs and symptoms of methemoglobinemia such as pale, gray, or blue colored skin; headache; fast heartbeat; shortness of breath; feeling faint or lightheaded, falls; tiredness Side effects that  usually do not require medical attention (report to your doctor or health care professional if they continue or are bothersome):  anxious  back pain  changes in taste  changes in vision  constipation  dizziness  fever  nausea, vomiting This list may not describe all possible side effects. Call your doctor for medical advice about side effects. You may report side effects to FDA at 1-800-FDA-1088. Where should I keep my medicine? This drug is given in a hospital or clinic and will not be stored at home. NOTE: This sheet is a summary. It may not cover all possible information. If you have questions about this medicine, talk to your doctor, pharmacist, or health care provider.  2021 Elsevier/Gold Standard (2019-05-23 12:24:57)
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
## 2020-05-15 NOTE — Op Note (Addendum)
05/15/2020  3:36 PM  PATIENT:  David Medina  40 y.o. male  PRE-OPERATIVE DIAGNOSIS:  Right Complete Rotator Cuff Tear and posterior labral Tear  POST-OPERATIVE DIAGNOSIS:   1.  Right shoulder oblique tear of the rotator cuff 2.  The anterior and posterior labral tears 3.  Partial tear of the subscapularis  4.  Medial subluxation of the biceps tendon 5.  Subacromial impingement  PROCEDURE:  Procedure(s): RIGHT SHOULDER ARTHROSCOPIC ANTERIOR AND POSTERIOR LABRAL REPAIRS  RIGHT SHOULDER ARTHROSCOPIC SUBACROMIAL DECOMPRESSION WITH MINI-OPEN ROTATOR CUFF REPAIR (Right)  SURGEON:  Surgeon(s) and Role:    Thornton Park, MD - Primary  ANESTHESIA:   general and paracervical block  PREOPERATIVE INDICATIONS:  David Medina is a  40 y.o. male with a diagnosis of Right Complete Rotator Cuff Tear and Labral Tears, confirmed by MRI, who failed conservative management and elected proceed with surgical fixation of his rotator cuff and labral tears..    The risks benefits and alternatives were discussed with the patient preoperatively including but not limited to the risks of infection, bleeding, nerve injury, persistent pain or weakness, failure of the hardware, re-tear of the rotator cuff and the need for further surgery. Medical risks include DVT and pulmonary embolism, myocardial infarction, stroke, pneumonia, respiratory failure and death. Patient understood these risks and wished to proceed.  OPERATIVE IMPLANTS: [employer] Multifix anchors x 2 for biceps tenodesis and rotator cuff repair.  Arthrex bio suture tack anchors x5 for anterior and posterior labral repairs.  OPERATIVE FINDINGS:   Patient was found to have a full-thickness tear of the rotator cuff.  This was an oblique full-thickness intratendinous tear pattern running from posterior medial to anterior lateral.  The patient had a partial tear of the subscapularis.  Patient had intratendinous split pattern with a partial tear  of the superior border of the lesser tuberosity without retraction.  Patient had medialization of the biceps tendon.  He had anterior inferior and posterior labral tears but no evidence of a SLAP tear.  OPERATIVE PROCEDURE:  I met with the patient preoperative area.  A pre-op history and physical was performed.  I signed the right shoulder according the hospital's correct site of surgery protocol.  I answered all questions by the patient.  An interscalene block with Exparel was performed by the anesthesia service in the preoperative area.  Patient was then brought to the operating room where he underwent general endotracheal intubation.  He was then transferred to the operating table and positioned in a beachchair position. All bony prominences were abundantly padded including the lower extremities.  Examination under anesthesia revealed no evidence of instability with load and shift testing, and a negative sulcus sign testing respectively.  The patient was then prepped and draped in a sterile fashion. The patient received 3 g of Ancef IV prior to the onset of the case.  A timeout was performed to verify the patient's name, date of birth, medical record number, correct site of surgery and correct procedure to be performed.The timeout was also used to verify the patient received antibiotics that all appropriate instruments, implants and radiographic studies were available in the room. Once all in attendance were in agreement case began.  Bony landmarks were drawn out with a surgical marker along with proposed incisions.  An 11 blade was used to establish a posterior portal through which the arthroscope was placed in the glenohumeral joint. An anterior portal was established under direct visualization using an 18-gauge spinal needle for  localization. A 5.75 mm arthroscopic cannula was inserted through the anterior portal. A full diagnostic examination of the glenohumeral joint was performed.  Switching  sticks were used to position the arthroscope through the anterior portal.  An accessory posterior lateral portal was established and a 7 mm cannula was placed through this portal.  A 5.75 mm cannula was placed through the posterior portal.  The posterior labrum was found to be torn from the 7:00 to the 11 o'clock position.  The labrum was mobilized with an arthroscopic elevator.  Frayed edges of the labrum were debrided with a 4.0 mm resector shaver blade.  The interval between the labrum and glenoid was burred using a 4.0 mm resector shaver blade until punctate bleeding was identified.  Three Arthrex bio suture tack anchors were placed in succession along the posterior labrum beginning with the 7 o'clock position, then 9 o'clock position and finally the 11 o'clock position.  A single limb of each of these suture anchors were shuttled under the labrum using a 90 degree Arthrex suture lasso and the labrum repaired using an arthroscopic knot tying technique.  The posterior labrum was probed after repair and found to be stable.  Final thorascopic images of the posterior labrum were taken.    Switching sticks were then used again to position the arthroscope through the posterior portal.  An accessory anterolateral portal was then created and a 7 mm arthroscopic cannula was placed through this portal.  Again the arthroscopic elevator was used to mobilize the anterior labrum.  A 4.0 mm resector shaver blade was used to debride the interval between the labrum and the glenoid.  The patient was found to have a labral tear from the 5:00 to the 3 o'clock position.  Two Arthrex bio suture tack anchors were placed in succession in the anterior inferior labrum again with the 5 o'clock position.  Again a 90 degree suture lasso was used to shuttle a single limb of these anchors under the anterior labrum.  An arthroscopic knot tying technique was used to approximate the anterior labrum to the anterior glenoid.  The anterior labrum  was then found to be stable to probing after repair and final arthroscopic images were taken.  The attention was then turned to addressing the biceps tendon which was medially subluxed.  A perfect pass suture was placed in the biceps tendon.  The biceps tendon was then released from the superior labrum using an arthroscopic scissor.  A biceps tenodesis was performed at the top of the intratubular screw using a [employer] multi fix anchor.  The arthroscope was then placed in the subacromial space. A lateral portal was then established using an 18-gauge spinal needle for localization. Extensive bursitis was encountered and debrided using a 4.0 resector shaver blade and a 90 ArthroCare wand from the lateral portal. A subacromial decompression was also performed using a 5.5 mm resector shaver blade from the lateral portal.    The oblique tendinous tear was then repaired with 4 side to side perfect pass sutures.  Two additional perfect pass sutures were placed in the medial portion of the tear.  A saber-type incision was made along the lateral border of the acromion. The deltoid muscle was identified and split in line with its fibers which allowed visualization of the rotator cuff.  The biceps tendon and its associated tagging suture were brought out through the deltoid split.  The two Perfect Pass sutures previously placed in the medial portion of the rotator cuff  tear were brought out through the deltoid split.  The two perfect pass sutures  were then anchored to the greater tuberosity of the humeral head using a single [employer] Multifix anchor. The sutures from this anchors were tensioned to allow secondary repair to the greater tuberosity footprint.  This reinforced the side to side repair of the rotator cuff tear.   Arthroscopic images of the repair from the glenohumeral joint were taken at the conclusion of the case.    All incisions were copiously irrigated. The deltoid fascia was repaired  using a 0 Vicryl suture in an interrupted fashion.   The subcutaneous tissue of all incisions were closed with a 2-0 Vicryl. Skin closure for the arthroscopic incisions was performed with 4-0 nylon. The skin edges of the saber incision were approximated with a running 4-0 undyed Monocryl.  A dry sterile dressing was applied.  The patient was placed in an abduction sling, with a Polar Care sleeve.  All sharp and instrument counts were correct at the conclusion of the case. I was scrubbed and present for the entire case. I spoke with the patient's wife by phone from the PACU postoperatively to let her know the case had been performed without complication and the patient was stable in recovery room.  I reviewed the postop instructions with her.

## 2020-05-15 NOTE — OR Nursing (Signed)
Discharge pending transportation 

## 2020-05-18 ENCOUNTER — Encounter: Payer: Self-pay | Admitting: Orthopedic Surgery

## 2020-05-18 NOTE — Anesthesia Postprocedure Evaluation (Signed)
Anesthesia Post Note  Patient: David Medina  Procedure(s) Performed: RIGHT SHOULDER ARTHROSCOPY WITH LABRAL REPAIR (Right Shoulder) RIGHT SHOULDER ARTHROSCOPY WITH OPEN ROTATOR CUFF REPAIR (Right Shoulder)  Patient location during evaluation: PACU Anesthesia Type: General Level of consciousness: awake and alert Pain management: pain level controlled Vital Signs Assessment: post-procedure vital signs reviewed and stable Respiratory status: spontaneous breathing, nonlabored ventilation, respiratory function stable and patient connected to nasal cannula oxygen Cardiovascular status: blood pressure returned to baseline and stable Postop Assessment: no apparent nausea or vomiting Anesthetic complications: no   No complications documented.   Last Vitals:  Vitals:   05/15/20 1701 05/15/20 1725  BP: (!) 141/74 125/67  Pulse: 92 95  Resp: 18 16  Temp:    SpO2: 94% 94%    Last Pain:  Vitals:   05/15/20 1725  TempSrc:   PainSc: 0-No pain                 Precious Haws Darrien Laakso

## 2020-11-25 ENCOUNTER — Encounter: Payer: Self-pay | Admitting: General Surgery

## 2021-05-11 ENCOUNTER — Ambulatory Visit (HOSPITAL_COMMUNITY)
Admission: RE | Admit: 2021-05-11 | Discharge: 2021-05-11 | Disposition: A | Discharge status: Home / Self Care | Payer: Medicaid Other | Source: Ambulatory Visit | Attending: Emergency Medicine | Admitting: Emergency Medicine

## 2021-05-11 ENCOUNTER — Other Ambulatory Visit (HOSPITAL_COMMUNITY): Payer: Self-pay | Admitting: Emergency Medicine

## 2021-05-11 DIAGNOSIS — R000 Tachycardia, unspecified: Secondary | ICD-10-CM | POA: Insufficient documentation

## 2021-05-11 DIAGNOSIS — R071 Chest pain on breathing: Secondary | ICD-10-CM | POA: Insufficient documentation

## 2021-05-11 MED ORDER — IOHEXOL 350 MG/ML SOLN
100.0000 mL | Freq: Once | INTRAVENOUS | Status: AC | PRN
  Administered 2021-05-11: 100 mL via INTRAVENOUS

## 2021-08-01 ENCOUNTER — Ambulatory Visit
Admission: EM | Admit: 2021-08-01 | Discharge: 2021-08-01 | Disposition: A | Discharge status: Home / Self Care | Payer: Medicaid Other | Attending: Family Medicine | Admitting: Family Medicine

## 2021-08-01 DIAGNOSIS — L02419 Cutaneous abscess of limb, unspecified: Secondary | ICD-10-CM | POA: Diagnosis not present

## 2021-08-01 DIAGNOSIS — L03119 Cellulitis of unspecified part of limb: Secondary | ICD-10-CM

## 2021-08-01 MED ORDER — SULFAMETHOXAZOLE-TRIMETHOPRIM 800-160 MG PO TABS: 1.0000 | ORAL_TABLET | Freq: Two times a day (BID) | ORAL | 0 refills | Status: DC

## 2021-08-01 MED ORDER — CEFTRIAXONE SODIUM 500 MG IJ SOLR
500.0000 mg | Freq: Once | INTRAMUSCULAR | Status: AC
  Administered 2021-08-01: 500 mg via INTRAMUSCULAR

## 2021-08-01 MED ORDER — CHLORHEXIDINE GLUCONATE 4 % EX LIQD: Freq: Every day | CUTANEOUS | 0 refills | Status: AC | PRN

## 2021-08-01 MED ORDER — MUPIROCIN 2 % EX OINT: 1.0000 "application " | TOPICAL_OINTMENT | Freq: Two times a day (BID) | CUTANEOUS | 0 refills | Status: AC

## 2021-08-01 NOTE — ED Provider Notes (Signed)
[facility]    CSN: [id number] Arrival date & time: 08/01/21  1301      History   Chief Complaint Chief Complaint  Patient presents with   Knee Pain    Right knee pain swollen and draining    HPI David Medina is a 41 y.o. male.   Presenting today with progressively worsening right knee pain, swelling, redness, stiffness.  States he had what looked like a shaved bump on the anterior knee several days ago that he popped, now has gotten thick drainage and blood from the area when he squeezes it.  States now the majority of his lower leg is swollen below the knee.  Denies fever, chills, numbness, tingling, loss of range of motion.  Has not tried anything over-the-counter for symptoms.     Past Medical History:  Diagnosis Date   Anxiety    DDD (degenerative disc disease), cervical    Depression    Sleep apnea     Patient Active Problem List   Diagnosis Date Noted   S/P umbilical hernia repair, follow-up exam 14/48/1856   Umbilical hernia without obstruction and without gangrene 11/28/2016   Chronic pain syndrome 08/03/2016   Anxiety associated with depression 02/16/2016   Insomnia secondary to depression with anxiety 02/16/2016   Gastroesophageal reflux disease 02/16/2016   Prediabetes 04/13/2015   Obesity, morbid, BMI 50 or higher ([place]) 12/22/2014   Neuropathic pain 12/22/2014   Restless leg syndrome 12/22/2014   Hx of gout 12/22/2014   Acute cystitis 10/16/2014   Benign neoplasm of skin of auricle 10/16/2014   Benign paroxysmal positional nystagmus 10/16/2014   Infection of urinary tract 10/16/2014   Degenerative disc disease, lumbar 10/16/2014   Blocked ear 10/16/2014   Family history of cardiac disorder 10/16/2014   Hypospadias, penile 10/16/2014    Past Surgical History:  Procedure Laterality Date   DENTAL SURGERY     SHOULDER ARTHROSCOPY WITH LABRAL REPAIR Right 05/15/2020   Procedure: RIGHT SHOULDER ARTHROSCOPY WITH LABRAL REPAIR;  Surgeon:  Thornton Park, MD;  Location: [facility];  Service: Orthopedics;  Laterality: Right;   SHOULDER ARTHROSCOPY WITH OPEN ROTATOR CUFF REPAIR AND DISTAL CLAVICLE ACROMINECTOMY Right 05/15/2020   Procedure: RIGHT SHOULDER ARTHROSCOPY WITH OPEN ROTATOR CUFF REPAIR;  Surgeon: Thornton Park, MD;  Location: [facility];  Service: Orthopedics;  Laterality: Right;   UMBILICAL HERNIA REPAIR N/A 12/21/2018   Procedure: HERNIA REPAIR UMBILICAL ADULT with Mesh;  Surgeon: Fredirick Maudlin, MD;  Location: [facility];  Service: General;  Laterality: N/A;       Home Medications    Prior to Admission medications   Medication Sig Start Date End Date Taking? Authorizing Provider  chlorhexidine (HIBICLENS) 4 % external liquid Apply topically daily as needed. 08/01/21  Yes Volney American, PA-C  mupirocin ointment (BACTROBAN) 2 % Apply 1 application. topically 2 (two) times daily. 08/01/21  Yes Volney American, PA-C  sulfamethoxazole-trimethoprim (BACTRIM DS) 800-160 MG tablet Take 1 tablet by mouth 2 (two) times daily. 08/01/21  Yes Volney American, PA-C  acetaminophen (TYLENOL) 325 MG tablet Take 1 tablet (325 mg total) by mouth every 4 (four) hours as needed. 05/15/20   Thornton Park, MD  DULoxetine (CYMBALTA) 30 MG capsule Take 90 mg by mouth at bedtime. 08/29/19   [provider]  ibuprofen (ADVIL) 200 MG tablet Take 200 mg by mouth every 6 (six) hours as needed for headache or moderate pain. Patient not taking: No sig reported    [provider]  loratadine-pseudoephedrine (CLARITIN-D 12-HOUR) 5-120 MG tablet Take 1 tablet by mouth daily as needed for allergies.    [provider]  ondansetron (ZOFRAN) 4 MG tablet Take 1 tablet (4 mg total) by mouth every 8 (eight) hours as needed for nausea or vomiting. 05/15/20   Thornton Park, MD  oxyCODONE (OXY IR/ROXICODONE) 5 MG immediate release tablet Take 1 tablet (5 mg total) by mouth every 4 (four) hours as needed. 05/15/20    Thornton Park, MD    Family History Family History  Problem Relation Age of Onset   Heart disease Other     Social History Social History   Tobacco Use   Smoking status: Former    Packs/day: 0.25    Years: 2.00    Pack years: 0.50    Types: Cigarettes    Quit date: 10/30/2018    Years since quitting: 2.7   Smokeless tobacco: Former  Scientific laboratory technician Use: Some days  Substance Use Topics   Alcohol use: Yes    Alcohol/week: 0.0 standard drinks   Drug use: No     Allergies   Cyclobenzaprine, Tramadol, Fentanyl and related, and Amoxicillin   Review of Systems Review of Systems Per HPI  Physical Exam Triage Vital Signs ED Triage Vitals  Enc Vitals Group     BP 08/01/21 1318 137/72     Pulse Rate 08/01/21 1318 90     Resp 08/01/21 1318 20     Temp 08/01/21 1318 97.9 F (36.6 C)     Temp Source 08/01/21 1318 Oral     SpO2 08/01/21 1318 96 %     Weight --      Height --      Head Circumference --      Peak Flow --      Pain Score 08/01/21 1314 10     Pain Loc --      Pain Edu? --      Excl. in [place]? --    No data found.  Updated Vital Signs BP 137/72 (BP Location: Right Arm)   Pulse 90   Temp 97.9 F (36.6 C) (Oral)   Resp 20   SpO2 96%   Visual Acuity Right Eye Distance:   Left Eye Distance:   Bilateral Distance:    Right Eye Near:   Left Eye Near:    Bilateral Near:     Physical Exam Vitals and nursing note reviewed.  Constitutional:      Appearance: Normal appearance.  HENT:     Head: Atraumatic.  Eyes:     Extraocular Movements: Extraocular movements intact.     Conjunctiva/sclera: Conjunctivae normal.  Cardiovascular:     Rate and Rhythm: Normal rate and regular rhythm.  Pulmonary:     Effort: Pulmonary effort is normal.     Breath sounds: Normal breath sounds.  Musculoskeletal:        General: Swelling and tenderness present. Normal range of motion.     Cervical back: Normal range of motion and neck supple.  Skin:     General: Skin is warm.     Findings: Erythema present.     Comments: 0.75 cm ulceration to right anterior leg just below knee, oozing blood and small amount of purulence.  Extensive erythema, edema, warmth wrapping around anterior knee and down midway toward ankle of lower leg.  Mild tenderness to palpation over this area.  Neurological:     General: No focal deficit present.     Mental  Status: He is oriented to person, place, and time.     Motor: No weakness.     Gait: Gait normal.     Comments: Right lower extremity neurovascular intact  Psychiatric:        Mood and Affect: Mood normal.        Thought Content: Thought content normal.        Judgment: Judgment normal.     UC Treatments / Results  Labs (all labs ordered are listed, but only abnormal results are displayed) Labs Reviewed - No data to display  EKG   Radiology No results found.  Procedures Procedures (including critical care time)  Medications Ordered in UC Medications  cefTRIAXone (ROCEPHIN) injection 500 mg (500 mg Intramuscular Given 08/01/21 1338)    Initial Impression / Assessment and Plan / UC Course  I have reviewed the triage vital signs and the nursing notes.  Pertinent labs & imaging results that were available during my care of the patient were reviewed by me and considered in my medical decision making (see chart for details).     Treat with IM Rocephin, discussed 10-day course of Bactrim, good home wound care with Hibiclens, mupirocin, leg elevation.  ED if worsening or not improving on these medications.  Follow-up in 1 week for recheck.  Final Clinical Impressions(s) / UC Diagnoses   Final diagnoses:  Cellulitis and abscess of leg   Discharge Instructions   None    ED Prescriptions     Medication Sig Dispense Auth. Provider   chlorhexidine (HIBICLENS) 4 % external liquid Apply topically daily as needed. 120 mL Volney American, PA-C   mupirocin ointment (BACTROBAN) 2 % Apply 1  application. topically 2 (two) times daily. 22 g Volney American, [place]   sulfamethoxazole-trimethoprim (BACTRIM DS) 800-160 MG tablet Take 1 tablet by mouth 2 (two) times daily. 20 tablet Volney American, [place]      PDMP not reviewed this encounter.   Volney American, [place] 08/01/21 1419

## 2021-08-01 NOTE — ED Triage Notes (Signed)
Pt states his right knee started swelling on Tuesday  Pt states there was a small bump on his right knee and he popped it   Pt states that this morning he had some drainage form the right knee  Pt states his knee is hot to touch and very itchy

## 2021-12-20 ENCOUNTER — Ambulatory Visit
Admission: EM | Admit: 2021-12-20 | Discharge: 2021-12-20 | Disposition: A | Discharge status: Home / Self Care | Payer: Medicaid Other | Attending: Nurse Practitioner | Admitting: Nurse Practitioner

## 2021-12-20 ENCOUNTER — Ambulatory Visit: Payer: Self-pay

## 2021-12-20 DIAGNOSIS — R600 Localized edema: Secondary | ICD-10-CM | POA: Diagnosis not present

## 2021-12-20 DIAGNOSIS — M25562 Pain in left knee: Secondary | ICD-10-CM | POA: Diagnosis not present

## 2021-12-20 NOTE — ED Provider Notes (Signed)
[facility]    CSN: [id number] Arrival date & time: 12/20/21  1427      History   Chief Complaint Chief Complaint  Patient presents with   Leg Pain    HPI David Medina is a 41 y.o. male.   Patient presents for left knee pain that began when he woke up this morning.  He denies any recent accident, fall, trauma, or injury to the knee.  Reports the pain is mostly on the top of his knee and is worse with weightbearing.  Has tried a knee brace that helped minimally; reports he had to take it off because it was too tight.  He denies weakness with weightbearing or walking, sensation of giving way, locking, popping, bruising, swelling, redness, numbness or tingling, or fevers, nausea/vomiting since the pain started.  Reports yesterday he cut the grass and wonders if he may have twisted it in a funny way.  Patient also reports his lower extremities have been swelling for quite some time.  Reports he is currently in between primary care providers.  He does not wear compression stockings.    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 anxiety 02/16/2016   Gastroesophageal reflux disease 02/16/2016   Prediabetes 04/13/2015   Obesity, morbid, BMI 50 or higher ([place]) 12/22/2014   Neuropathic pain 12/22/2014   Restless leg syndrome 12/22/2014   Hx of gout 12/22/2014   Acute cystitis 10/16/2014   Benign neoplasm of skin of auricle 10/16/2014   Benign paroxysmal positional nystagmus 10/16/2014   Infection of urinary tract 10/16/2014   Degenerative disc disease, lumbar 10/16/2014   Blocked  ear 10/16/2014   Family history of cardiac disorder 10/16/2014   Hypospadias, penile 10/16/2014    Past Surgical History:  Procedure Laterality Date   DENTAL SURGERY     SHOULDER ARTHROSCOPY WITH LABRAL REPAIR Right 05/15/2020   Procedure: RIGHT SHOULDER ARTHROSCOPY WITH LABRAL REPAIR;  Surgeon: Thornton Park, MD;  Location: [facility];  Service: Orthopedics;  Laterality: Right;   SHOULDER ARTHROSCOPY WITH OPEN ROTATOR CUFF REPAIR AND DISTAL CLAVICLE ACROMINECTOMY Right 05/15/2020   Procedure: RIGHT SHOULDER ARTHROSCOPY WITH OPEN ROTATOR CUFF REPAIR;  Surgeon: Thornton Park, MD;  Location: [facility];  Service: Orthopedics;  Laterality: Right;   UMBILICAL HERNIA REPAIR N/A 12/21/2018   Procedure: HERNIA REPAIR UMBILICAL ADULT with Mesh;  Surgeon: Fredirick Maudlin, MD;  Location: [facility];  Service: General;  Laterality: N/A;       Home Medications    Prior to Admission medications   Medication Sig Start Date End Date Taking? Authorizing Provider  acetaminophen (TYLENOL) 325 MG tablet Take 1 tablet (325 mg total) by mouth every 4 (four) hours as needed. 05/15/20   Thornton Park, MD  chlorhexidine (HIBICLENS) 4 % external liquid Apply topically daily as needed. 08/01/21   Volney American, PA-C  ibuprofen (ADVIL) 200 MG tablet Take 200 mg by mouth every 6 (six) hours as needed for headache or moderate pain. Patient not taking: Reported on 05/06/2020    [provider]  loratadine-pseudoephedrine (CLARITIN-D 12-HOUR) 5-120 MG tablet Take 1  tablet by mouth daily as needed for allergies.    [provider]  mupirocin ointment (BACTROBAN) 2 % Apply 1 application. topically 2 (two) times daily. 08/01/21   Volney American, PA-C  ondansetron (ZOFRAN) 4 MG tablet Take 1 tablet (4 mg total) by mouth every 8 (eight) hours as needed for nausea or vomiting. 05/15/20   Thornton Park, MD    Family History Family History  Problem Relation Age of Onset   Heart disease  Other     Social History Social History   Tobacco Use   Smoking status: Former    Packs/day: 0.25    Years: 2.00    Total pack years: 0.50    Types: Cigarettes    Quit date: 10/30/2018    Years since quitting: 3.1   Smokeless tobacco: Former  Scientific laboratory technician Use: Some days  Substance Use Topics   Alcohol use: Yes    Alcohol/week: 0.0 standard drinks of alcohol   Drug use: No     Allergies   Cyclobenzaprine, Tramadol, Fentanyl, Fentanyl and related, and Amoxicillin   Review of Systems Review of Systems Per HPI  Physical Exam Triage Vital Signs ED Triage Vitals  Enc Vitals Group     BP 12/20/21 1519 (!) 151/80     Pulse Rate 12/20/21 1519 92     Resp --      Temp 12/20/21 1519 97.9 F (36.6 C)     Temp Source 12/20/21 1519 Oral     SpO2 12/20/21 1519 93 %     Weight --      Height --      Head Circumference --      Peak Flow --      Pain Score 12/20/21 1521 10     Pain Loc --      Pain Edu? --      Excl. in [place]? --    No data found.  Updated Vital Signs BP (!) 151/80 (BP Location: Right Arm)   Pulse 92   Temp 97.9 F (36.6 C) (Oral)   SpO2 93%   Visual Acuity Right Eye Distance:   Left Eye Distance:   Bilateral Distance:    Right Eye Near:   Left Eye Near:    Bilateral Near:     Physical Exam Vitals and nursing note reviewed.  Constitutional:      General: He is not in acute distress.    Appearance: Normal appearance. He is not toxic-appearing.  HENT:     Head: Normocephalic and atraumatic.     Mouth/Throat:     Mouth: Mucous membranes are moist.     Pharynx: Oropharynx is clear.  Cardiovascular:     Rate and Rhythm: Normal rate and regular rhythm.  Pulmonary:     Effort: Pulmonary effort is normal. No respiratory distress.  Musculoskeletal:     Right knee: Normal. Normal pulse.     Left knee: No swelling, deformity, effusion or erythema. Normal range of motion. Tenderness present. Normal pulse.     Right lower leg: No deformity,  tenderness or bony tenderness. 2+ Edema present.     Left lower leg: No deformity, tenderness or bony tenderness. 2+ Edema present.     Right ankle: Normal pulse.     Left ankle: Normal pulse.       Legs:     Comments: Inspection: No swelling, obvious deformity, or redness to the left knee  Palpation: Left knee diffusely tender to palpation in  approximately area marked; no obvious deformities palpated ROM: Full passive range of motion to left knee Strength: 5/5 left lower extremity Neurovascular: neurovascularly intact in left and right lower extremity    Skin:    General: Skin is warm and dry.     Capillary Refill: Capillary refill takes less than 2 seconds.     Coloration: Skin is not jaundiced or pale.     Findings: No erythema or rash.  Neurological:     Mental Status: He is alert and oriented to person, place, and time.  Psychiatric:        Behavior: Behavior is cooperative.      UC Treatments / Results  Labs (all labs ordered are listed, but only abnormal results are displayed) Labs Reviewed - No data to display  EKG   Radiology No results found.  Procedures Procedures (including critical care time)  Medications Ordered in UC Medications - No data to display  Initial Impression / Assessment and Plan / UC Course  I have reviewed the triage vital signs and the nursing notes.  Pertinent labs & imaging results that were available during my care of the patient were reviewed by me and considered in my medical decision making (see chart for details).  Patient is well-appearing, normotensive, afebrile, not tachycardic, not tachypneic, oxygenating well on room air.    Acute pain of left knee Suspect knee strain ACE wrap given Tylenol/ibuprofen for pain Follow up with Orthopedic provider with no improvement in symptoms despite treatment  Edema of both lower extremities Suspect cause is multifactorial Start compression stockings  Encouraged close follow up with  PCP PCP assistance initiated   The patient was given the opportunity to ask questions.  All questions answered to their satisfaction.  The patient is in agreement to this plan.    Final Clinical Impressions(s) / UC Diagnoses   Final diagnoses:  Acute pain of left knee  Edema of both lower extremities     Discharge Instructions      I suspect the pain in your knee is secondary to a strain in your knee; please wear an ace wrap to help provide compression and stabilize your knee  With worsening or no improvement in knee pain, follow up with Orthopedic provider (contact information provided)  We have initiated primary care assistance; please establish care with a PCP as soon as you are able to for blood work and to check on the swelling in your legs.  In the meantime, start compression stockings.     ED Prescriptions   None    I have reviewed the PDMP during this encounter.   Eulogio Bear, NP 12/20/21 [phone]

## 2021-12-20 NOTE — Discharge Instructions (Addendum)
I suspect the pain in your knee is secondary to a strain in your knee; please wear an ace wrap to help provide compression and stabilize your knee  With worsening or no improvement in knee pain, follow up with Orthopedic provider (contact information provided)  We have initiated primary care assistance; please establish care with a PCP as soon as you are able to for blood work and to check on the swelling in your legs.  In the meantime, start compression stockings.

## 2021-12-20 NOTE — ED Triage Notes (Signed)
Pt reports pain and swelling in left knee since this morning. Knee brace gave relief for couples hrs.

## 2021-12-27 ENCOUNTER — Ambulatory Visit: Payer: Medicaid Other | Admitting: Family Medicine

## 2022-01-12 ENCOUNTER — Ambulatory Visit: Payer: Medicaid Other | Admitting: Family Medicine

## 2022-04-28 ENCOUNTER — Encounter: Payer: Self-pay | Admitting: Radiology

## 2022-09-02 IMAGING — CT CT ANGIO CHEST
2 of 7 series · 18 of 46 positions shown · IV contrast (Omnipaque or Isovue)
Comparison: None.

CLINICAL DATA: Tachycardia, chest pain, elevated D-dimer

EXAM:
CT ANGIOGRAPHY CHEST WITH CONTRAST
TECHNIQUE: Multidetector CT imaging of the chest was performed using the
standard protocol during bolus administration of intravenous
contrast. Multiplanar CT image reconstructions and MIPs were
obtained to evaluate the vascular anatomy.

[Series 5: pe axial thins · axial · 0.70mm/px · z∈[+1400,+1605]mm · 15 of 288 slices shown]
[im 16/288  lung]
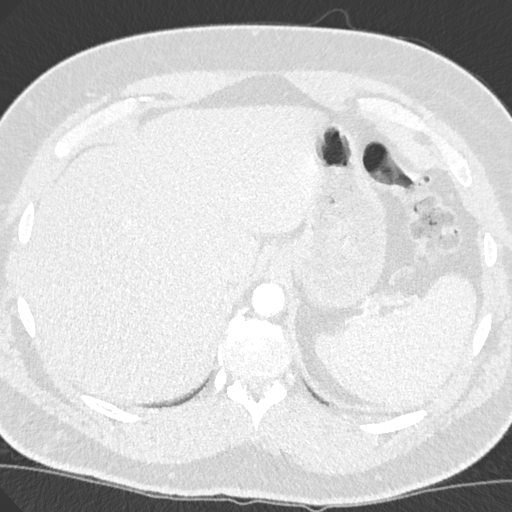
[im 32/288  soft-tissue]
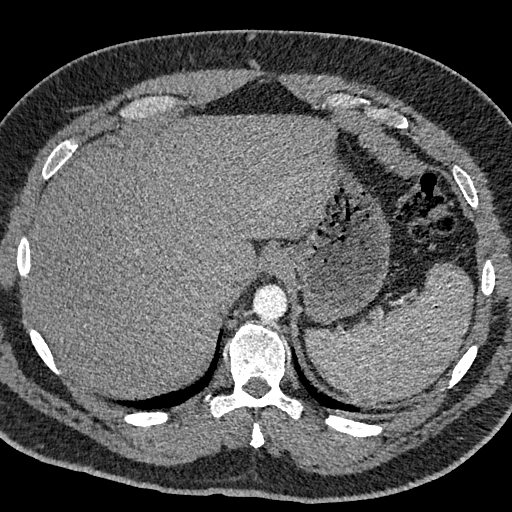
[im 48/288  lung]
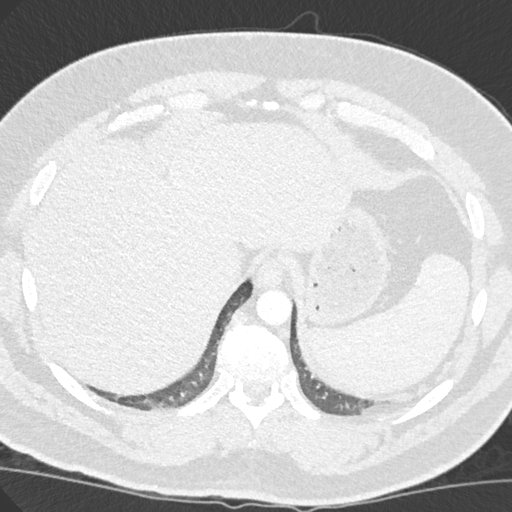
[im 64/288  soft-tissue]
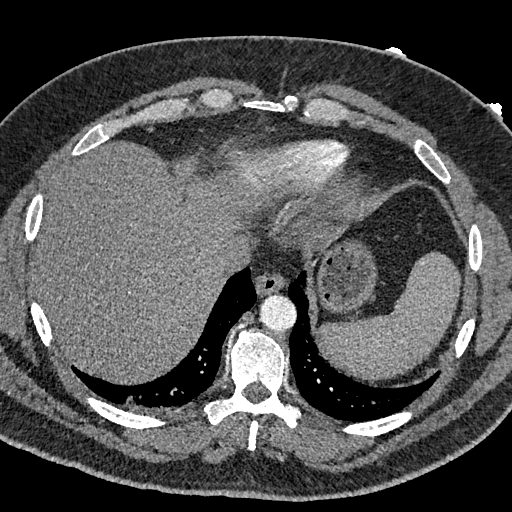
[im 96/288  lung]
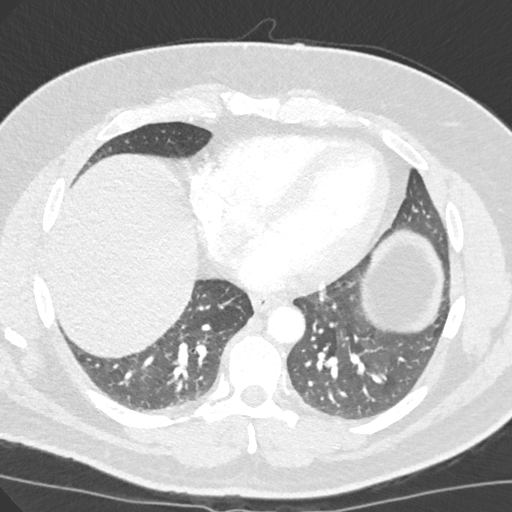
[im 112/288  soft-tissue]
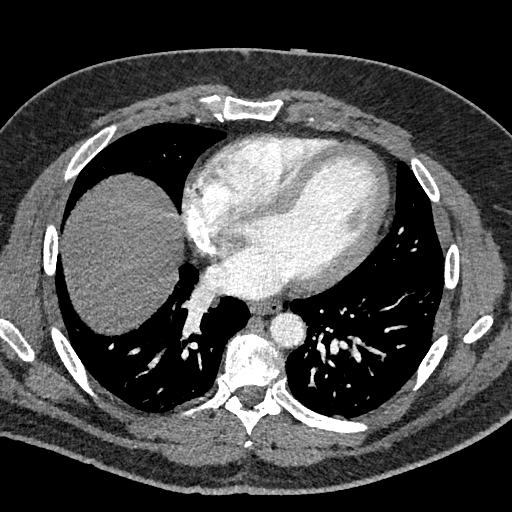
[im 128/288  lung]
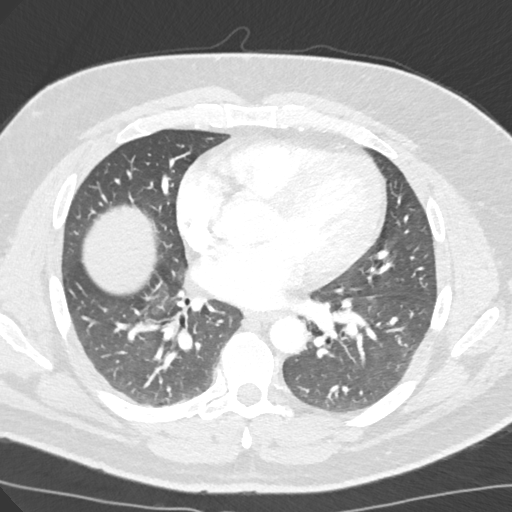
[im 144/288  soft-tissue]
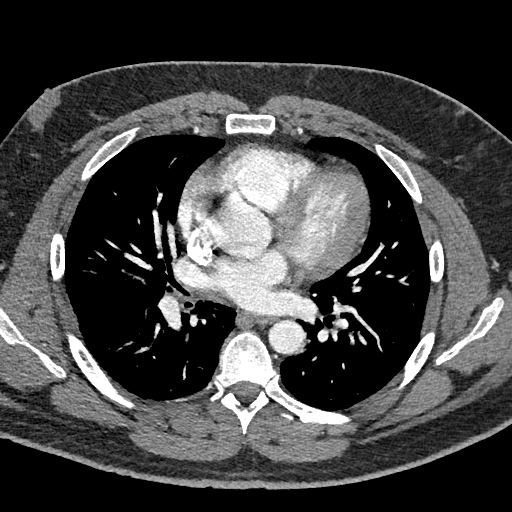
[im 160/288  lung]
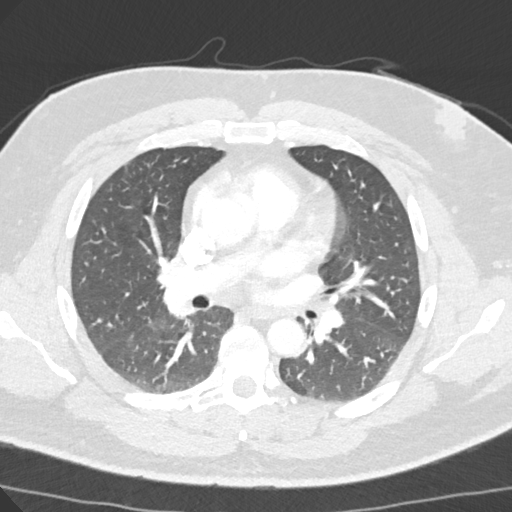
[im 176/288  soft-tissue]
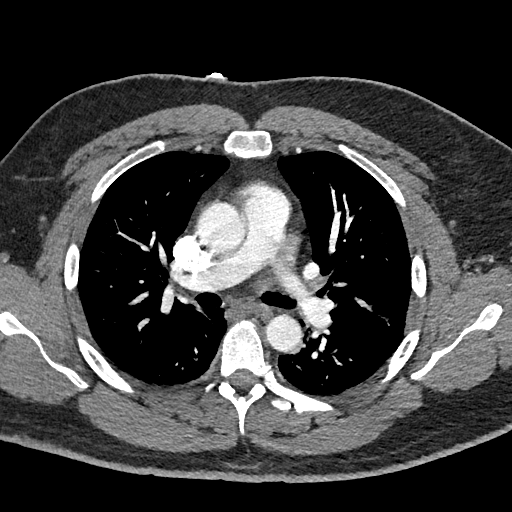
[im 192/288  lung]
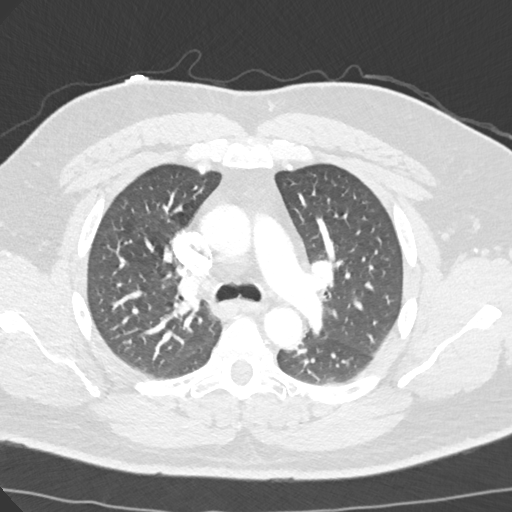
[im 224/288  soft-tissue]
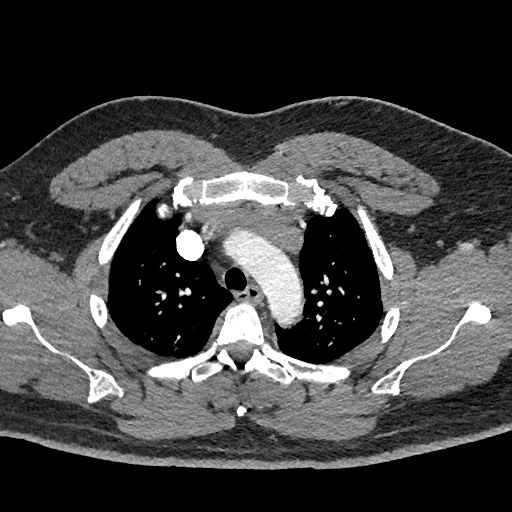
[im 240/288  lung]
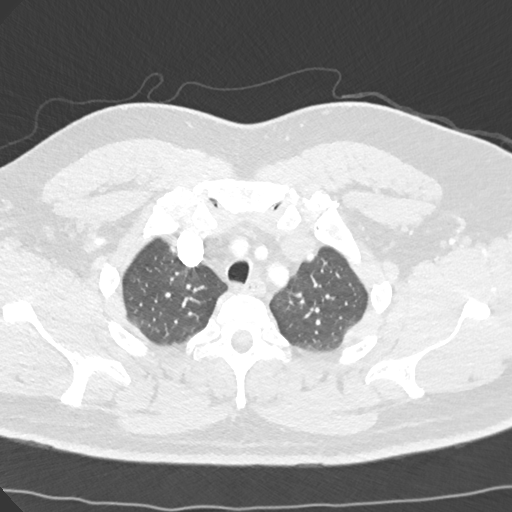
[im 256/288  soft-tissue]
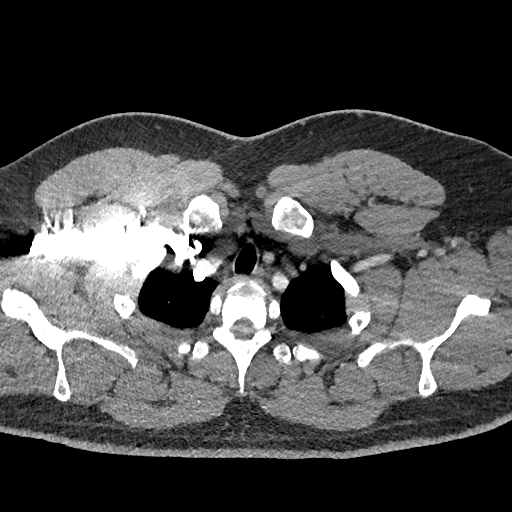
[im 272/288  lung]
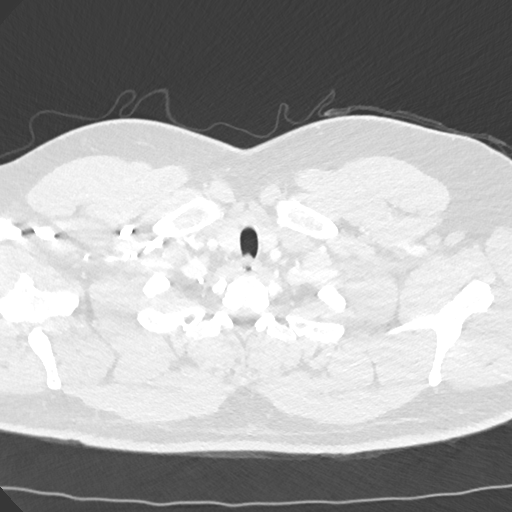

[Series 8: cor soft · coronal · 0.48mm/px · 3 of 151 slices shown]
[im 38/151  soft-tissue]
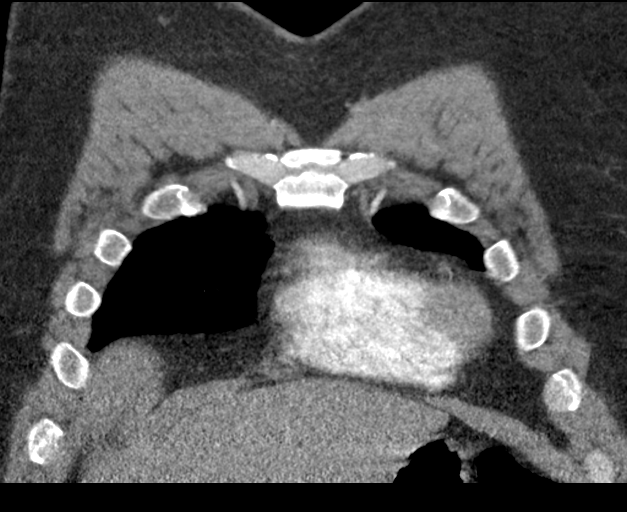
[im 76/151  soft-tissue]
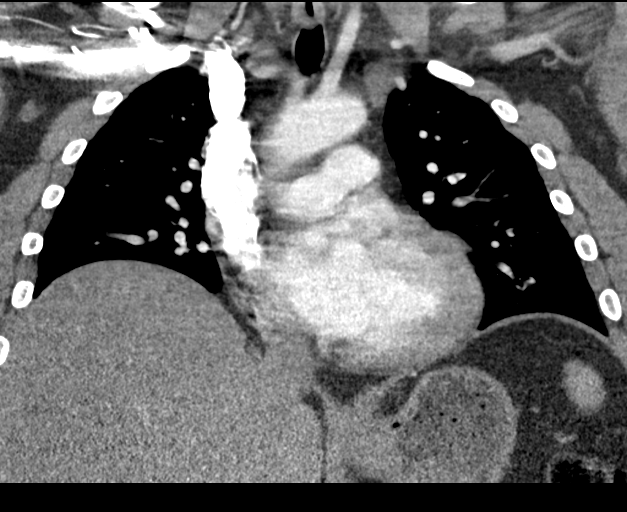
[im 113/151  soft-tissue]
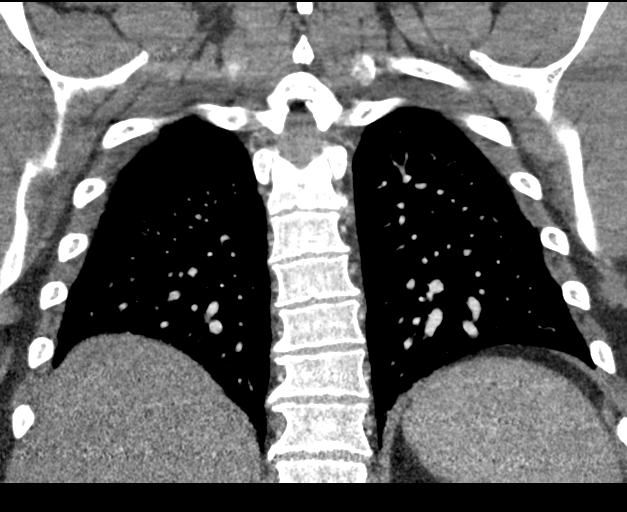

[18 of 46 positions shown; findings below may reference images not displayed]

RADIATION DOSE REDUCTION: This exam was performed according to the
departmental dose-optimization program which includes automated
exposure control, adjustment of the mA and/or kV according to
patient size and/or use of iterative reconstruction technique.

CONTRAST:  100mL OMNIPAQUE IOHEXOL 350 MG/ML SOLN
FINDINGS: Cardiovascular: This is a technically adequate evaluation of the
pulmonary vasculature. No filling defects or pulmonary emboli. The
heart is unremarkable without pericardial effusion. No evidence of
thoracic aortic aneurysm or dissection.

Mediastinum/Nodes: No enlarged mediastinal, hilar, or axillary lymph
nodes. Thyroid gland, trachea, and esophagus demonstrate no
significant findings.

Lungs/Pleura: No acute airspace disease, effusion, or pneumothorax.
Central airways are patent.

Upper Abdomen: No acute abnormality.

Musculoskeletal: No acute or destructive bony lesions. Reconstructed
images demonstrate no additional findings.

Review of the MIP images confirms the above findings.
IMPRESSION: 1. No evidence of pulmonary embolus.
2. No acute intrathoracic process.

## 2023-04-14 ENCOUNTER — Emergency Department (HOSPITAL_COMMUNITY)
Admission: EM | Admit: 2023-04-14 | Discharge: 2023-04-14 | Disposition: A | Discharge status: Home / Self Care | Payer: 59 | Attending: Emergency Medicine | Admitting: Emergency Medicine

## 2023-04-14 ENCOUNTER — Encounter (HOSPITAL_COMMUNITY): Payer: Self-pay | Admitting: Emergency Medicine

## 2023-04-14 ENCOUNTER — Emergency Department (HOSPITAL_COMMUNITY): Payer: 59

## 2023-04-14 ENCOUNTER — Other Ambulatory Visit: Payer: Self-pay

## 2023-04-14 DIAGNOSIS — X501XXA Overexertion from prolonged static or awkward postures, initial encounter: Secondary | ICD-10-CM | POA: Insufficient documentation

## 2023-04-14 DIAGNOSIS — M25561 Pain in right knee: Secondary | ICD-10-CM | POA: Diagnosis present

## 2023-04-14 MED ORDER — KETOROLAC TROMETHAMINE 15 MG/ML IJ SOLN
15.0000 mg | Freq: Once | INTRAMUSCULAR | Status: AC
  Administered 2023-04-14: 15 mg via INTRAMUSCULAR
  Filled 2023-04-14: qty 1

## 2023-04-14 NOTE — ED Triage Notes (Signed)
Pt reports getting out of vehicle and possibly pulling something in right leg yesterday. Endorses pain to behind right knee.

## 2023-04-14 NOTE — ED Provider Notes (Signed)
[place] [facility] AT [facility] Provider Note   CSN: [id number] Arrival date & time: 04/14/23  1237     History  Chief Complaint  Patient presents with   Leg Pain    David Medina is a 43 y.o. male who presents to the ED for evaluation of right knee pain.  Patient reports that yesterday he was getting out of an elevated truck, he is unsure how many feet off the ground this drug is elevated.  Reports that he was in the driver seat, stepped out with his left foot and then swung his right leg out and stepped on a gravel when he felt a "pop" in his right knee.  He reports that ever since that time he has had pain in the posterior lateral portion of his knee.  Denies a history of injuries to right knee.  Denies history of surgeries to right knee.  Denies any medications prior to arrival.  Reports that he has been having difficulty ambulating on the right leg since the event.  Denies numbness or tingling.   Leg Pain      Home Medications Prior to Admission medications   Medication Sig Start Date End Date Taking? Authorizing Provider  acetaminophen (TYLENOL) 325 MG tablet Take 1 tablet (325 mg total) by mouth every 4 (four) hours as needed. 05/15/20   Juanell Fairly, MD  chlorhexidine (HIBICLENS) 4 % external liquid Apply topically daily as needed. 08/01/21   Particia Nearing, PA-C  ibuprofen (ADVIL) 200 MG tablet Take 200 mg by mouth every 6 (six) hours as needed for headache or moderate pain. Patient not taking: Reported on 05/06/2020    [provider]  loratadine-pseudoephedrine (CLARITIN-D 12-HOUR) 5-120 MG tablet Take 1 tablet by mouth daily as needed for allergies.    [provider]  mupirocin ointment (BACTROBAN) 2 % Apply 1 application. topically 2 (two) times daily. 08/01/21   Particia Nearing, PA-C  ondansetron (ZOFRAN) 4 MG tablet Take 1 tablet (4 mg total) by mouth every 8 (eight) hours as needed for nausea or vomiting.  05/15/20   Juanell Fairly, MD      Allergies    Cyclobenzaprine, Tramadol, Fentanyl, Fentanyl and related, and Amoxicillin    Review of Systems   Review of Systems  Musculoskeletal:  Positive for arthralgias.  All other systems reviewed and are negative.   Physical Exam Updated Vital Signs BP (!) 144/94   Pulse 85   Temp 97.8 F (36.6 C) (Oral)   Resp 18   Ht 5\' 7"  (1.702 m)   Wt (!) 140.6 kg   SpO2 92%   BMI 48.55 kg/m  Physical Exam Vitals and nursing note reviewed.  Constitutional:      General: He is not in acute distress.    Appearance: He is well-developed.  HENT:     Head: Normocephalic and atraumatic.  Eyes:     Conjunctiva/sclera: Conjunctivae normal.  Cardiovascular:     Rate and Rhythm: Normal rate and regular rhythm.     Heart sounds: No murmur heard. Pulmonary:     Effort: Pulmonary effort is normal. No respiratory distress.     Breath sounds: Normal breath sounds.  Abdominal:     Palpations: Abdomen is soft.     Tenderness: There is no abdominal tenderness.  Musculoskeletal:        General: No swelling.     Cervical back: Neck supple.     Comments: No obvious swelling, deformity of the  patient right knee.  The patient has appropriate range of motion actively and passively.  Negative Lachman sign.  Skin:    General: Skin is warm and dry.     Capillary Refill: Capillary refill takes less than 2 seconds.  Neurological:     Mental Status: He is alert.  Psychiatric:        Mood and Affect: Mood normal.     ED Results / Procedures / Treatments   Labs (all labs ordered are listed, but only abnormal results are displayed) Labs Reviewed - No data to display  EKG None  Radiology DG Knee Complete 4 Views Right Result Date: 04/14/2023 CLINICAL DATA:  Posterior right knee pain after feeling a pop stepping out of the truck yesterday. EXAM: RIGHT KNEE - COMPLETE 4+ VIEW COMPARISON:  None Available. FINDINGS: No acute fracture or dislocation. No joint  effusion. Mild medial compartment joint space narrowing with tiny marginal osteophytes. Diffuse soft tissue swelling. IMPRESSION: 1. Diffuse soft tissue swelling without acute osseous abnormality. Electronically Signed   By: Obie Dredge M.D.   On: 04/14/2023 16:10    Procedures Procedures   Medications Ordered in ED Medications  ketorolac (TORADOL) 15 MG/ML injection 15 mg (15 mg Intramuscular Given 04/14/23 1347)    ED Course/ Medical Decision Making/ A&P  Medical Decision Making Amount and/or Complexity of Data Reviewed Radiology: ordered.  Risk Prescription drug management.   43 year old who presents for evaluation.  Please see HPI for further details.  On examination the patient right knee has no obvious swelling, no obvious deformity.  He has full range of motion of the right knee both actively and passively.  Negative Lachman sign.  Will collect x-ray imaging and provide Toradol.  X-ray imaging shows soft tissue swelling however no acute bony abnormality.  Patient placed in knee immobilizer and given crutches.  He will be referred to orthopedics for further management and care.  Advised to wear knee immobilizer while ambulating, can take it off to shower or at night for bed.  He voiced understanding.  Advised to take Tylenol and ibuprofen for pain in the meantime.  Also given instructions on RICE therapy.  Voices understanding.  Stable to discharge home.   Final Clinical Impression(s) / ED Diagnoses Final diagnoses:  Acute pain of right knee    Rx / DC Orders ED Discharge Orders     None         Clent Ridges 04/14/23 1658    Terrilee Files, MD 04/14/23 1745

## 2023-04-14 NOTE — Discharge Instructions (Addendum)
It was a pleasure taking part in your care today.  As we discussed, x-ray imaging shows soft tissue swelling in your knee.  There could be further injuries such as ligament injuries however we are unable to assess this using only x-ray.  Please follow-up with the orthopedic doctor, Dr. Hulda Humphrey, this week.  Please call his office on Monday and make an appointment to be seen.  Please utilize knee immobilizer in the interim as well as crutches.  You may use the knee immobilizer while you are walking, please take this off at night or to shower.  Please return to the ED with any new or worsening symptoms.  You may take Tylenol or ibuprofen in the meantime for pain.  You may also apply ice.  Please read attached guide concerning RICE therapy at home.
